# Patient Record
Sex: Male | Born: 1950
Health system: Southern US, Community
[De-identification: ages and names within clinical notes are randomized; demographics above are authoritative.]

## PROBLEM LIST (undated history)

## (undated) DIAGNOSIS — R7303 Prediabetes: Secondary | ICD-10-CM

## (undated) DIAGNOSIS — I4891 Unspecified atrial fibrillation: Secondary | ICD-10-CM

## (undated) DIAGNOSIS — N529 Male erectile dysfunction, unspecified: Secondary | ICD-10-CM

## (undated) HISTORY — DX: Prediabetes: R73.03

## (undated) HISTORY — DX: Male erectile dysfunction, unspecified: N52.9

## (undated) HISTORY — PX: OTHER SURGICAL HISTORY: SHX169

## (undated) HISTORY — DX: Unspecified atrial fibrillation: I48.91

---

## 2003-02-28 HISTORY — PX: CHOLECYSTECTOMY: SHX55

## 2013-02-27 DIAGNOSIS — I4891 Unspecified atrial fibrillation: Secondary | ICD-10-CM

## 2013-02-27 HISTORY — DX: Unspecified atrial fibrillation: I48.91

## 2014-01-07 ENCOUNTER — Telehealth: Payer: Self-pay | Admitting: Internal Medicine

## 2014-01-07 NOTE — Telephone Encounter (Signed)
Received records from [facility] (Dr Lerry Linerwight Williams) for appointment on 01/28/14 with Dr Rennis GoldenHilty.  [facility] for Dr Blanchie DessertHilty's schedule on 01/28/14.  lp

## 2014-01-28 ENCOUNTER — Ambulatory Visit: Payer: Self-pay | Admitting: Internal Medicine

## 2014-02-02 ENCOUNTER — Other Ambulatory Visit: Payer: Self-pay

## 2014-03-20 ENCOUNTER — Encounter: Payer: Self-pay | Admitting: Internal Medicine

## 2014-04-07 ENCOUNTER — Encounter: Payer: Self-pay | Admitting: Internal Medicine

## 2014-04-07 ENCOUNTER — Ambulatory Visit (INDEPENDENT_AMBULATORY_CARE_PROVIDER_SITE_OTHER): Payer: 59 | Admitting: Internal Medicine

## 2014-04-07 VITALS — BP 118/78 | HR 90 | Temp 98.0°F | Ht 68.0 in | Wt 227.0 lb

## 2014-04-07 DIAGNOSIS — I482 Chronic atrial fibrillation, unspecified: Secondary | ICD-10-CM

## 2014-04-07 DIAGNOSIS — Z0000 Encounter for general adult medical examination without abnormal findings: Secondary | ICD-10-CM

## 2014-04-07 DIAGNOSIS — I4891 Unspecified atrial fibrillation: Secondary | ICD-10-CM | POA: Insufficient documentation

## 2014-04-07 DIAGNOSIS — R7309 Other abnormal glucose: Secondary | ICD-10-CM

## 2014-04-07 DIAGNOSIS — R7303 Prediabetes: Secondary | ICD-10-CM

## 2014-04-07 DIAGNOSIS — R739 Hyperglycemia, unspecified: Secondary | ICD-10-CM | POA: Insufficient documentation

## 2014-04-07 NOTE — Patient Instructions (Signed)
Get your blood work before you leave    Please come back to the office in 3 months  for a routine check up   

## 2014-04-07 NOTE — Assessment & Plan Note (Addendum)
Td ~ 8 years ago per patient  Labs from 09-2013:  BMP, LFTs normal.  Cholesterol 153, LDL 94, triglycerides 125. Will check a CBC, repeat the BMP, TSH.   Colon cancer screening: Never had a colonoscopy, dx today w/  A. fib, we will discuss screening at the next visit.  DRE negative today, check a PSA  Diet   discussed. For now, I recommend to hold exercise until he  sees cardiology, the patient request a letter  stating that

## 2014-04-07 NOTE — Assessment & Plan Note (Addendum)
Few months ago was told he has prediabetes, took farxiga temporarily. I couldn't find the  A1c results Plan: Check A1c

## 2014-04-07 NOTE — Progress Notes (Signed)
Pre visit review using our clinic review tool, if applicable. No additional management support is needed unless otherwise documented below in the visit note. 

## 2014-04-07 NOTE — Assessment & Plan Note (Addendum)
On exam the pulse appeared regular but his EKG   showed atrial fibrillation, looks similar to previously EKG 09-2013 Last year he was referred to cardiology but that never happened  I discussed the case with cardiology, they will call the patient and set an appointment in the next few days, we decided to hold   anticoagulants and I recommend patient to start an aspirin daily. i discussed what a fib is and risk of strokes

## 2014-04-07 NOTE — Progress Notes (Signed)
Subjective:    Patient ID: Sean Frye, male    DOB: 11-07-1950, 64 y.o.   MRN: [id number]  DOS:  04/07/2014 Type of visit - description : New patient, request a physical exam.  In general feeling well. Had labs done 09-2013, apparently was diagnosed with prediabetes and prescribed farxiga which he took for a couple of months. Also, an EKG was abnormal and was referred to cardiology, denies any cardiopulmonary symptoms, see review of systems. He never got to see the heart doctor.   Review of Systems  Constitutional: Negative for diaphoresis, appetite change and unexpected weight change.  HENT: Negative for dental problem, ear discharge, facial swelling, trouble swallowing and voice change.   Eyes: Negative for photophobia, discharge and redness.  Respiratory:       No cough or sputum production. No wheezing or SOB  Cardiovascular:       No CP No edema or  palpitations   Gastrointestinal: Negative for nausea, vomiting, abdominal pain and diarrhea.       No blood in the stools   Endocrine: Negative for polydipsia, polyphagia and polyuria.  Genitourinary: Negative for urgency, frequency, hematuria and difficulty urinating.       No dysuria  Musculoskeletal: Negative for joint swelling.       No unusual aches or pains   Skin: Negative for color change, pallor and rash.  Allergic/Immunologic: Negative for environmental allergies and food allergies.  Neurological: Negative for dizziness, syncope and speech difficulty.       No headaches   Hematological: Negative for adenopathy. Does not bruise/bleed easily.  Psychiatric/Behavioral: Negative for suicidal ideas, hallucinations, behavioral problems and confusion.       No unusual or severe anxiety-depression     Past Medical History  Diagnosis Date  . Prediabetes     Past Surgical History  Procedure Laterality Date  . Lipoma removal    . Cholecystectomy  2005    History   Social History  . Marital Status: Single   Spouse Name: N/A    Number of Children: 5   . Years of Education: N/A   Occupational History  . Barrister's clerkairplane mechanic, [employer]    Social History Main Topics  . Smoking status: Never Smoker   . Smokeless tobacco: Never Used  . Alcohol Use: No  . Drug Use: No  . Sexual Activity: Not on file   Other Topics Concern  . Not on file   Social History Narrative   [place] from [place]   Lives w/ Mrs Darcel Bayleyrma     Family History  Problem Relation Age of Onset  . CAD Neg Hx   . Diabetes Sister   . Diabetes Mother   . Colon cancer Neg Hx   . Prostate cancer Father     ? of cancer        Medication List       This list is accurate as of: 04/07/14  7:27 PM.  Always use your most recent med list.               aspirin 81 MG tablet  Take 81 mg by mouth daily.           Objective:   Physical Exam  Constitutional: He is oriented to person, place, and time. He appears well-developed. No distress.  HENT:  Head: Normocephalic and atraumatic.  Neck: Normal range of motion. Neck supple. No thyromegaly present.  Normal carotid pulses  Cardiovascular:  RRR, no murmur, rub or gallop  Pulmonary/Chest: Effort normal. No stridor. No respiratory distress.  CTA B  Abdominal: Soft. Bowel sounds are normal. He exhibits no distension and no mass. There is no tenderness. There is no rebound and no guarding.  Genitourinary:  Rectal: No external abnormalities noted. Normal sphincter tone. No rectal masses or tenderness.  Stool: none found Prostate: Prostate gland firm and smooth, no enlargement, nodularity, tenderness, mass, asymmetry or induration.  Musculoskeletal: Normal range of motion. He exhibits no edema or tenderness.  Lymphadenopathy:    He has no cervical adenopathy.  Neurological: He is alert and oriented to person, place, and time. No cranial nerve deficit. He exhibits normal muscle tone. Coordination normal.  Speech normal, gait unassisted and normal for age, motor strength  appropriate for age   Skin: Skin is warm and dry. No pallor.  No jaundice  Psychiatric: He has a normal mood and affect. His behavior is normal. Judgment and thought content normal.  Vitals reviewed.        Assessment & Plan:    Today , I addition to the CPX Ispent more than  20  min with the patient discussing the dx of atrial fib, prediabetes and coordinating his care   Problem List Items Addressed This Visit      Cardiovascular and Mediastinum   Atrial fibrillation    EKG from 09-2013 showed atrial fibrillation, he was referred to cardiology but that never happened  EKG today looks similar. I discussed the case with cardiology, they will call the patient and set an appointment in the next few days, we decided to hold   anticoagulants and I recommend patient to start an aspirin daily.      Relevant Medications   aspirin 81 MG tablet     Endocrine   Prediabetes - Primary    Few months ago was told he has prediabetes, took far see had temporarily. I don't see the A1c results Plan: Check A1c      Relevant Orders   Hemoglobin A1c     Other   Annual physical exam    Td ~ 8 years ago per patient  Labs from 09-2013:  BMP, LFTs normal.  Cholesterol 153, LDL 94, triglycerides 125. Will check a CBC, repeat the BMP, TSH.   Colon cancer screening: Never had a colonoscopy, dx today w/  A. fib, we will discuss screening at the next visit.  DRE negative today, check a PSA  Diet   discussed. For now, I recommend to hold exercise until he  sees cardiology, the patient request a letter  stating that        Relevant Orders   EKG 12-Lead (Completed)   TSH   Basic metabolic panel   CBC with Differential/Platelet

## 2014-04-08 ENCOUNTER — Telehealth: Payer: Self-pay | Admitting: Internal Medicine

## 2014-04-08 DIAGNOSIS — I4891 Unspecified atrial fibrillation: Secondary | ICD-10-CM

## 2014-04-08 LAB — CBC WITH DIFFERENTIAL/PLATELET
BASOS PCT: 0.5 % (ref 0.0–3.0)
Basophils Absolute: 0 10*3/uL (ref 0.0–0.1)
Eosinophils Absolute: 0.3 10*3/uL (ref 0.0–0.7)
Eosinophils Relative: 3.1 % (ref 0.0–5.0)
HEMATOCRIT: 48.3 % (ref 39.0–52.0)
Hemoglobin: 16.4 g/dL (ref 13.0–17.0)
LYMPHS PCT: 31.3 % (ref 12.0–46.0)
Lymphs Abs: 2.6 10*3/uL (ref 0.7–4.0)
MCHC: 34 g/dL (ref 30.0–36.0)
MCV: 83.6 fl (ref 78.0–100.0)
Monocytes Absolute: 0.4 10*3/uL (ref 0.1–1.0)
Monocytes Relative: 4.4 % (ref 3.0–12.0)
Neutro Abs: 5 10*3/uL (ref 1.4–7.7)
Neutrophils Relative %: 60.7 % (ref 43.0–77.0)
Platelets: 228 10*3/uL (ref 150.0–400.0)
RBC: 5.78 Mil/uL (ref 4.22–5.81)
RDW: 14.5 % (ref 11.5–15.5)
WBC: 8.3 10*3/uL (ref 4.0–10.5)

## 2014-04-08 LAB — BASIC METABOLIC PANEL
BUN: 16 mg/dL (ref 6–23)
CO2: 26 mEq/L (ref 19–32)
Calcium: 8.9 mg/dL (ref 8.4–10.5)
Chloride: 104 mEq/L (ref 96–112)
Creatinine, Ser: 1.05 mg/dL (ref 0.40–1.50)
GFR: 75.97 mL/min (ref >60.00)
Glucose, Bld: 101 mg/dL — ABNORMAL HIGH (ref 70–99)
Potassium: 3.8 mEq/L (ref 3.5–5.1)
Sodium: 138 mEq/L (ref 135–145)

## 2014-04-08 LAB — HEMOGLOBIN A1C: HEMOGLOBIN A1C: 6.7 % — AB (ref 4.6–6.5)

## 2014-04-08 LAB — TSH: TSH: 2.31 u[IU]/mL (ref 0.35–4.50)

## 2014-04-08 NOTE — Telephone Encounter (Signed)
Done

## 2014-04-08 NOTE — Telephone Encounter (Signed)
Caller name:Sharon Relationship to patient:Cardiology Can be reached:[phone] Pharmacy:  Reason for call:Need order in system for referral to cardiology

## 2014-05-05 ENCOUNTER — Ambulatory Visit (INDEPENDENT_AMBULATORY_CARE_PROVIDER_SITE_OTHER): Payer: 59 | Admitting: Internal Medicine

## 2014-05-05 ENCOUNTER — Encounter: Payer: Self-pay | Admitting: Internal Medicine

## 2014-05-05 VITALS — BP 124/76 | HR 94 | Temp 98.3°F | Ht 68.0 in | Wt 224.0 lb

## 2014-05-05 DIAGNOSIS — M25569 Pain in unspecified knee: Secondary | ICD-10-CM

## 2014-05-05 NOTE — Progress Notes (Signed)
   Subjective:    Patient ID: Sean Frye, male    DOB: [birth year], 64 y.o.   MRN: [id number]  DOS:  05/05/2014 Type of visit - description : acute Interval history: 3-4 months history of lower extremity pain. The pain is like a muscle ache located anteriorly proximal to the knees and also having some pain at the left Achilles tendon. Has not taken any medication for it.    Review of Systems Denies chest pain, difficulty breathing or palpitations. No lower extremity discoloration. No calve swelling or pain per se (he does have a asymmetry, see physical exam) No claudication, no back pain.  Past Medical History  Diagnosis Date  . Prediabetes     Past Surgical History  Procedure Laterality Date  . Lipoma removal    . Cholecystectomy  2005    History   Social History  . Marital Status: Single    Spouse Name: N/A  . Number of Children: 5   . Years of Education: N/A   Occupational History  . Barrister's clerkairplane mechanic, [employer]    Social History Main Topics  . Smoking status: Never Smoker   . Smokeless tobacco: Never Used  . Alcohol Use: No  . Drug Use: No  . Sexual Activity: Not on file   Other Topics Concern  . Not on file   Social History Narrative   [place]   Lives w/ Mrs Darcel Bayleyrma   Former professional soccer player        Medication List       This list is accurate as of: 05/05/14 11:59 PM.  Always use your most recent med list.               aspirin 81 MG tablet  Take 81 mg by mouth daily.     fluticasone 27.5 MCG/SPRAY nasal spray  Commonly known as:  VERAMYST  Place 2 sprays into the nose daily.           Objective:   Physical Exam BP 124/76 mmHg  Pulse 94  Temp(Src) 98.3 F (36.8 C) (Oral)  Ht 5\' 8"  (1.727 m)  Wt 224 lb (101.606 kg)  BMI 34.07 kg/m2  SpO2 97%  General:   Well developed, well nourished . NAD.  HEENT:  Normocephalic . Face symmetric, atraumatic  CV-- irregular Muscle skeletal: no pretibial edema  bilaterally  Normal femoral and pedal pulses Feet normal to inspection and palpation, warm, good capillary refill throughout. Left calf  normal. Right calf: the gastrocnemius is much more prominent on the right side, but no calf swelling per se Skin: Not pale. Not jaundice Neurologic:  alert & oriented X3.  Speech normal, gait appropriate for age and unassisted Psych--  Cognition and judgment appear intact.  Cooperative with normal attention span and concentration.  Behavior appropriate. No anxious or depressed appearing.       Assessment & Plan:    Lower extremity pain, No evidence of venous  or arterial vascular deficit. Doubt neurogenic claudication as symptoms are mostly at rest. Plan: Stretching, Tylenol, observation for now  Atrial fibrillation, irreg heart rate, patient asymptomatic and takes aspirin, to see cardiology tomorrow

## 2014-05-05 NOTE — Patient Instructions (Signed)
Take Tylenol 500 mg 2 tablets 3 times a day as needed for pain. Come back in 2 months for a checkup  Tome tylenol de 500 mg 2 tabletas hasta 3 veces al dia regrese en 2 meses para un chequeo

## 2014-05-05 NOTE — Progress Notes (Signed)
Pre visit review using our clinic review tool, if applicable. No additional management support is needed unless otherwise documented below in the visit note. 

## 2014-05-06 ENCOUNTER — Encounter: Payer: Self-pay | Admitting: Internal Medicine

## 2014-05-06 ENCOUNTER — Ambulatory Visit (INDEPENDENT_AMBULATORY_CARE_PROVIDER_SITE_OTHER): Payer: 59 | Admitting: Internal Medicine

## 2014-05-06 VITALS — BP 124/62 | HR 105 | Ht 71.0 in | Wt 224.6 lb

## 2014-05-06 DIAGNOSIS — I4891 Unspecified atrial fibrillation: Secondary | ICD-10-CM

## 2014-05-06 DIAGNOSIS — Z79899 Other long term (current) drug therapy: Secondary | ICD-10-CM

## 2014-05-06 DIAGNOSIS — R5383 Other fatigue: Secondary | ICD-10-CM

## 2014-05-06 DIAGNOSIS — Z01818 Encounter for other preprocedural examination: Secondary | ICD-10-CM

## 2014-05-06 DIAGNOSIS — D689 Coagulation defect, unspecified: Secondary | ICD-10-CM

## 2014-05-06 MED ORDER — RIVAROXABAN 20 MG PO TABS: 20.0000 mg | ORAL_TABLET | Freq: Every day | ORAL | Status: DC

## 2014-05-06 MED ORDER — METOPROLOL SUCCINATE ER 25 MG PO TB24: 12.5000 mg | ORAL_TABLET | Freq: Every day | ORAL | Status: DC

## 2014-05-06 NOTE — Progress Notes (Signed)
OFFICE NOTE  Chief Complaint:  Atrial fibrillation  Primary Care Physician: Willow Ora, MD  HPI:  Sean Frye is a pleasant 64 year old male who is coming referred to me for evaluation of atrial fibrillation. Apparently he had an EKG in August of this past year which demonstrated A. fib and was referred to cardiology, but follow-up was never made. He was scheduled to see me in December but had to travel to [place] for family. He recently saw his primary care provider and was again noted to be in A. fib. He was started on aspirin which she's been taking for 2 weeks. He is 49 with no cardiac risk factors therefore has a low CHADSVASC score of 0-1. He is totally unaware of his atrial fibrillation. He denies any worsening shortness of breath, fatigue or exercise intolerance. He also denies any chest pain. He continues to do some exercise and some weight lifting without any limitations. He reports good sleep at night and denies snoring or any signs of sleep apnea. There is no history that he is aware of of A. fib in the family.  PMHx:  Past Medical History  Diagnosis Date  . Prediabetes     Past Surgical History  Procedure Laterality Date  . Lipoma removal    . Cholecystectomy  2005    FAMHx:  Family History  Problem Relation Age of Onset  . CAD Neg Hx   . Diabetes Sister   . Diabetes Mother     cirrhosis  . Colon cancer Neg Hx   . Prostate cancer Father     ? of cancer     SOCHx:   reports that he has never smoked. He has never used smokeless tobacco. He reports that he does not drink alcohol or use illicit drugs.  ALLERGIES:  No Known Allergies  ROS: A comprehensive review of systems was negative.  HOME MEDS: Current Outpatient Prescriptions  Medication Sig Dispense Refill  . fluticasone (VERAMYST) 27.5 MCG/SPRAY nasal spray Place 2 sprays into the Frye daily.    . NON FORMULARY as directed. Libera - for sinuses - from [place]    . metoprolol succinate (TOPROL XL) 25  MG 24 hr tablet Take 0.5 tablets (12.5 mg total) by mouth daily. 15 tablet 6  . rivaroxaban (XARELTO) 20 MG TABS tablet Take 1 tablet (20 mg total) by mouth daily with supper. 30 tablet 6   No current facility-administered medications for this visit.    LABS/IMAGING: No results found for this or any previous visit (from the past 48 hour(s)). No results found.  VITALS: BP 124/62 mmHg  Pulse 105  Ht  (1.803 m)  Wt 224 lb 9.6 oz (101.878 kg)  BMI 31.34 kg/m2  EXAM: General appearance: alert and no distress Neck: no carotid bruit and no JVD Lungs: clear to auscultation bilaterally Heart: irregularly irregular rhythm Abdomen: soft, non-tender; bowel sounds normal; no masses,  no organomegaly Extremities: extremities normal, atraumatic, no cyanosis or edema Pulses: 2+ and symmetric Skin: Skin color, texture, turgor normal. No rashes or lesions Neurologic: Grossly normal Psych: Normal  EKG: Atrial fibrillation with rapid ventricular response at 105  ASSESSMENT: 1. A. fib with mild RVR 2. Low CHADSVASC score of 0-1  PLAN: 1.   [name] has atrial fibrillation for which she is asymptomatic. There is mild increase in heart rate. I would recommend starting low-dose Toprol XL 12.5 mg daily to help lower his heart rate. In addition, I'm recommending a TEE cardioversion. I  discussed the risks and benefits of this procedure and feel that this is the most expeditious way to identify whether he has developed any clot in the heart that could put him at risk for stroke with cardioversion. If the study is negative, then we would go ahead with cardioversion. Is unclear how long he's been in A. fib, so there is a possibility that he may not convert back to sinus. I discussed the risks and benefits of this procedure and he did provide informed consent through a Engineer, structuralpanish translator. I will start him today on Xarelto 20 mg daily and advised him to stop the aspirin. Based on his commercial  insurance, he should be able to get the Xarelto at no cost. He will need to be on Xarelto for at least 3 days before we could do a cardioversion. He was advised to take the Xarelto with food for best absorption. Counseling on side effects and bleeding possibilities on that medicine was provided by Belenda CruiseKristin, our anticoagulation pharmacist.  I will keep you informed of the results of his TEE cardioversion in the near future. Thanks as always for the kind referral.  Sean NoseKenneth C. Lyniah Fujita, MD, [facility] Farmington HillsFACC Attending Cardiologist [facility]  Neithan Day C 05/06/2014, 5:06 PM

## 2014-05-06 NOTE — Patient Instructions (Addendum)
Your physician has recommended you make the following change in your medication:  1. STOP aspirin.  2. START xarelto 67m once daily.  3. START metoprolol succinate 12.574m(1/2 of a 2523mablet) once daily - for heart rate   Dr. HilDebara Picketts ordered a TEE/Cardio[facility] and a chest x-ray 3-5 days prior to your procedure >> please go to [place] [facility] [employer]  >> Gre[facility] Scriptschest x-ray   Fibrilacin auricular  (Atrial Fibrillation)  La fibrilacin auricular es un trastorno en el que el corazn late de manera irregular. Tambin puede causar que el corazn lata ms rpido que lo normal. Puede impedir que el corazn bombee la sangre normalmente. Aumenta el riesgo de ictus y de problemas cardacos.  CUIDADOS EN EL HOGAR   Tome los medicamentos como le indic el mdico.  Slo tome los medicamentos que el mdico considere seguros. Algunos medicamentos pueden hacer que el trastorno empeore o que ocurra nuevamente.  Si el mdico le receta anticoagulantes, tmelos exactamente segn las indicaciones. Un exceso de antStage manageri recibe una cantidad insuficiente, no tendr la proteccin necesaria contra el ictus y otrXcel EnergyRealice anlisis de sanUGI Corporationsa, si se lo indic el mdico.  Haga los anlApplied Materialsdic el mdico.  No beba alcohol.  No ingiera bebidas que contengan cafena, como el caf, las gaseosas y algunos tipos de t.  Mantenga un peso saludable.  No utilice pldoras para adelgazar excepto que su mdico le diga que son seguras. Pueden empeorar el problema cardaco.  Siga su dieta segn las indicaciones de su mdico.  Haga ejercicios tal como le indic el mdiLong Viewsitas de control. SOLICITE AYUDA SI:   Nota un cambio en la frecuencia, el ritmo o en la fuerza de sus latidos  cardacos.  Tiene ganas de hacer pis (orinar) con ms frecuencia.  Se cansa con ms facilidad al moverse o al hacer ejercicios. SOLICITE AYUDA DE INMEDIATO SI:   Siente un dolor en el pecho o en el vientre (abdomen).  Tiene malHigher education careers adviseruseas).  Tiene dificultad para respirar.  Se le hinchan los pies y los tobillos de manCaspianpentina.  Tiene mareos.  Siente el rostro, los brazos o las piernas adormecidos o dbiles.  Hay algn cambio en la visin o en el habla. ASEGRESE DE QUE:   Comprende estas instrucciones.  Controlar su enfermedad.  Solicitar ayuda de inmediato si no mejora o si empeora. Document Released: 05/12/2008 Document Revised: 06/30/2013 [facility] Cent[facility] Information 2015 ExiMurphyhis information is not intended to replace advice given to you by your health care provider. Make sure you discuss any questions you have with your health care provider.   Cardioversin elctrica (ElMagazine features editora cardioversin elctrica es la aplicacin de un impacto elctrico para modificar el ritmo cardaco. Se colocan unos parches con pegamento o placas metlicas sobre el pecho para enviar electricidad desde un dispositivo. Esto se utiliza para restablecer el ritmo cardaco normal. Un ritmo demasiado rpido o irregular impide que el corazn bombee bieGeneral Electrica cardioversin elctrica se realiza en una emergencia si:   Como resultado del ritmo cardaco la presin arterial es baja o no hay presin.  El ritmo normal debe restablecerse lo antes posible para proteger al cerebro y al corazn de un dao ms importante.  Puede salvar la vida. La cardioversin puede realizarse para ritmos cardacos que no pongan inmediatamente en peligro la vida, como en el caso de aleteo o fibrilacin auricular, si:   El corazn late [place] rpido o de manera irregular.  Los medicamentos para cambiar el ritmo cardaco no [employer].  Es seguro esperar para  permitir un tiempo de preparacin.  Los sntomas de ritmo cardaco anormal son molestos.  El riesgo de ictus y de otros problemas graves puede reducirse. INFORME A SU MDICO:   Cualquier alergia que tenga.  Todos los L[employer]ell [employer], incluidos vitaminas, hierbas, gotas oftlmicas, cremas y medicamentos de venta libre.  Problemas previos que usted o los UnitedHealth de su familia hayan tenido con el uso de anestsicos.  Enfermedades de la sangre que tenga.  Cirugas previas.  Enfermedades que tenga. RIESGOS Y COMPLICACIONES  En general, se trata de un procedimiento seguro. Sin embargo, pueden ocurrir [employer], por ejemplo:   Problemas respiratorios relacionados con la anestesia.  Un cogulo de [employer] se desprende y [place] hacia otras partes del cuerpo. Esto puede causar un ictus u otros problemas. Este riesgo se reduce con el uso de medicamentos anticoagulantes antes del procedimiento.  Paro cardaco (poco frecuente). ANTES DEL PROCEDIMIENTO   Pueden hacerle estudios para detectar cogulos sanguneos en el corazn y para evaluar la actividad cardaca.  Podrn indicarle que comience a tomar anticoagulantes para que su sangre no forme cogulos fcilmente.  Le darn medicamentos para estabilizar su frecuencia y ritmo cardaco. PROCEDIMIENTO  Le administrarn un medicamento a travs de una va intravenosa (IV) para reducir las molestias y Nature conservation officer dormir (sedante).  Se enviar una descarga elctrica. DESPUS DEL PROCEDIMIENTO El ritmo cardaco se observar para asegurarse de que no cambia.  Document Released: 02/13/2005 Document Revised: 06/30/2013 [facility] Patient Information 2015 [place], [place]. This information is not intended to replace advice given to you by your health care provider. Make sure you discuss any questions you have with your health care provider.   Ecocardiograma transesofgico (Transesophageal Echocardiogram) El ecocardiograma  transesofgico (ETE) es un estudio de imgenes de su corazn que se realiza mediante ondas de sonido. Las [employer] se obtienen del corazn son Automotive engineer. Esto puede ayudar al mdico a detectar si hay problemas con su corazn. El ETE puede controlar lo siguiente:  Si hay cogulos de Chiropractor.  Si las vlvulas cardacas estn funcionando bien.  Si tiene una infeccin en la parte interna del corazn.  Algunas de las arterias principales del corazn.  Si la vlvula cardaca funciona bien despus de una reparacin.  Su corazn antes de un procedimiento que [place] un choque elctrico para que el ritmo cardaco vuelva a lo normal. ANTES DEL PROCEDIMIENTO  No coma ni beba nada durante las 6 horas previas al procedimiento o segn le haya indicado el mdico.  Planifique para que alguien lo lleve de vuelta a su casa. No conduzca de regreso a Medical illustrator.  Le insertarn una va intravenosa en el brazo. PROCEDIMIENTO  Le administrarn un medicamento para ayudarlo a relajarse (sedante). Se lo administrarn a travs de la va intravenosa.  Le colocarn anestsicos en la parte posterior de la garganta para adormecerla.  La punta de la sonda se coloca en la parte posterior de la boca. Le pedirn que trague. Esto ayudar a que la sonda pase hacia el esfago.  Una vez que la punta de la sonda est en el lugar correcto, el mdico podr captar imgenes del corazn.  Es probable que sienta una presin en la parte posterior de la garganta. DESPUS DEL PROCEDIMIENTO  Lo llevarn a un rea de recuperacin hasta que el efecto del sedante haya desaparecido.  Es posible que sienta la garganta dolorida y con picazn. Esto mejorar lentamente con [employer].  Regresar a su casa cuando est totalmente despierto y pueda tragar lquidos.  Deber solicitarle a alguien que se quede con usted durante las siguientes 24 horas.  No conduzca ni opere mquinas durante las siguientes 24 horas. Document  Released: 03/18/2010 Document Revised: 02/18/2013 [facility] Patient Information 2015 [place]. This information is not intended to replace advice given to you by your health care provider. Make sure you discuss any questions you have with your health care provider.

## 2014-05-07 ENCOUNTER — Other Ambulatory Visit: Payer: Self-pay | Admitting: *Deleted

## 2014-05-07 DIAGNOSIS — I4891 Unspecified atrial fibrillation: Secondary | ICD-10-CM

## 2014-05-07 DIAGNOSIS — IMO0002 Reserved for concepts with insufficient information to code with codable children: Secondary | ICD-10-CM

## 2014-05-08 ENCOUNTER — Ambulatory Visit
Admission: RE | Admit: 2014-05-08 | Discharge: 2014-05-08 | Disposition: A | Discharge status: Home / Self Care | Payer: 59 | Source: Ambulatory Visit | Attending: Internal Medicine | Admitting: Internal Medicine

## 2014-05-08 DIAGNOSIS — Z01818 Encounter for other preprocedural examination: Secondary | ICD-10-CM

## 2014-05-08 LAB — CBC
HCT: 48.8 % (ref 39.0–52.0)
HEMOGLOBIN: 16 g/dL (ref 13.0–17.0)
MCH: 28.1 pg (ref 26.0–34.0)
MCHC: 32.8 g/dL (ref 30.0–36.0)
MCV: 85.8 fL (ref 78.0–100.0)
MPV: 10 fL (ref 8.6–12.4)
PLATELETS: 215 10*3/uL (ref 150–400)
RBC: 5.69 MIL/uL (ref 4.22–5.81)
RDW: 14.6 % (ref 11.5–15.5)
WBC: 6.2 10*3/uL (ref 4.0–10.5)

## 2014-05-08 LAB — BASIC METABOLIC PANEL
BUN: 18 mg/dL (ref 6–23)
CO2: 27 meq/L (ref 19–32)
Calcium: 9.2 mg/dL (ref 8.4–10.5)
Chloride: 105 mEq/L (ref 96–112)
Creat: 1.11 mg/dL (ref 0.50–1.35)
Glucose, Bld: 109 mg/dL — ABNORMAL HIGH (ref 70–99)
POTASSIUM: 4.5 meq/L (ref 3.5–5.3)
Sodium: 140 mEq/L (ref 135–145)

## 2014-05-09 LAB — TSH: TSH: 1.864 u[IU]/mL (ref 0.350–4.500)

## 2014-05-09 LAB — PROTIME-INR
INR: 1.45 (ref <1.50)
Prothrombin Time: 17.6 seconds — ABNORMAL HIGH (ref 11.6–15.2)

## 2014-05-09 LAB — APTT: APTT: 36 s (ref 24–37)

## 2014-05-12 ENCOUNTER — Encounter: Payer: Self-pay | Admitting: *Deleted

## 2014-05-14 ENCOUNTER — Ambulatory Visit (HOSPITAL_COMMUNITY): Payer: 59 | Admitting: Anesthesiology

## 2014-05-14 ENCOUNTER — Encounter (HOSPITAL_COMMUNITY)
Admission: RE | Disposition: A | Discharge status: Home / Self Care | Payer: Self-pay | Source: Ambulatory Visit | Attending: Internal Medicine

## 2014-05-14 ENCOUNTER — Ambulatory Visit (HOSPITAL_COMMUNITY)
Admission: RE | Admit: 2014-05-14 | Discharge: 2014-05-14 | Disposition: A | Discharge status: Home / Self Care | Payer: 59 | Source: Ambulatory Visit | Attending: Internal Medicine | Admitting: Internal Medicine

## 2014-05-14 ENCOUNTER — Encounter (HOSPITAL_COMMUNITY): Payer: Self-pay | Admitting: *Deleted

## 2014-05-14 DIAGNOSIS — IMO0002 Reserved for concepts with insufficient information to code with codable children: Secondary | ICD-10-CM

## 2014-05-14 DIAGNOSIS — I4891 Unspecified atrial fibrillation: Secondary | ICD-10-CM

## 2014-05-14 DIAGNOSIS — I340 Nonrheumatic mitral (valve) insufficiency: Secondary | ICD-10-CM

## 2014-05-14 HISTORY — PX: CARDIOVERSION: SHX1299

## 2014-05-14 HISTORY — PX: TEE WITHOUT CARDIOVERSION: SHX5443

## 2014-05-14 SURGERY — ECHOCARDIOGRAM, TRANSESOPHAGEAL
Anesthesia: Monitor Anesthesia Care

## 2014-05-14 MED ORDER — PROPOFOL INFUSION 10 MG/ML OPTIME
INTRAVENOUS | Status: DC | PRN
  Administered 2014-05-14: 160 ug/kg/min via INTRAVENOUS

## 2014-05-14 MED ORDER — LIDOCAINE VISCOUS 2 % MT SOLN
OROMUCOSAL | Status: AC
  Filled 2014-05-14: qty 15

## 2014-05-14 MED ORDER — SODIUM CHLORIDE 0.9 % IV SOLN
INTRAVENOUS | Status: DC
  Administered 2014-05-14: 13:00:00 via INTRAVENOUS

## 2014-05-14 MED ORDER — LIDOCAINE HCL (CARDIAC) 20 MG/ML IV SOLN
INTRAVENOUS | Status: DC | PRN
  Administered 2014-05-14: 80 mg via INTRAVENOUS

## 2014-05-14 MED ORDER — PHENYLEPHRINE HCL 10 MG/ML IJ SOLN
INTRAMUSCULAR | Status: DC | PRN
  Administered 2014-05-14: 80 ug via INTRAVENOUS

## 2014-05-14 MED ORDER — SODIUM CHLORIDE 0.9 % IV SOLN
INTRAVENOUS | Status: DC | PRN
  Administered 2014-05-14 (×2): via INTRAVENOUS

## 2014-05-14 MED ORDER — BUTAMBEN-TETRACAINE-BENZOCAINE 2-2-14 % EX AERO
INHALATION_SPRAY | CUTANEOUS | Status: DC | PRN
  Administered 2014-05-14: 2 via TOPICAL

## 2014-05-14 MED ORDER — LIDOCAINE VISCOUS 2 % MT SOLN
OROMUCOSAL | Status: DC | PRN
  Administered 2014-05-14: 1 via OROMUCOSAL

## 2014-05-14 MED ORDER — SODIUM CHLORIDE 0.9 % IJ SOLN: 3.0000 mL | INTRAMUSCULAR | Status: DC | PRN

## 2014-05-14 NOTE — Anesthesia Postprocedure Evaluation (Signed)
  Anesthesia Post-op Note  Patient: Sean BaronHector Frye  Procedure(s) Performed: Procedure(s): TRANSESOPHAGEAL ECHOCARDIOGRAM (TEE) (N/A) CARDIOVERSION (N/A)  Patient Location: PACU  Anesthesia Type:MAC  Level of Consciousness: awake, alert , oriented and patient cooperative  Airway and Oxygen Therapy: Patient Spontanous Breathing  Post-op Pain: none  Post-op Assessment: Post-op Vital signs reviewed, Patient's Cardiovascular Status Stable, Respiratory Function Stable, Patent Airway, No signs of Nausea or vomiting and Pain level controlled  Post-op Vital Signs: stable  Last Vitals:  Filed Vitals:   05/14/14 1411  BP: 110/81  Pulse: 62  Temp:   Resp: 15    Complications: No apparent anesthesia complications

## 2014-05-14 NOTE — Progress Notes (Signed)
Interpreter Wyvonnia DuskyGraciela Namihira for post electro-cardioversion

## 2014-05-14 NOTE — Transfer of Care (Signed)
Immediate Anesthesia Transfer of Care Note  Patient: Sean Frye  Procedure(s) Performed: Procedure(s): TRANSESOPHAGEAL ECHOCARDIOGRAM (TEE) (N/A) CARDIOVERSION (N/A)  Patient Location: PACU  Anesthesia Type:MAC and General  Level of Consciousness: awake, alert , oriented and patient cooperative  Airway & Oxygen Therapy: Patient Spontanous Breathing and Patient connected to nasal cannula oxygen  Post-op Assessment: Report given to RN, Post -op Vital signs reviewed and stable and Patient moving all extremities  Post vital signs: Reviewed and stable  Last Vitals: There were no vitals filed for this visit.  Complications: No apparent anesthesia complications

## 2014-05-14 NOTE — H&P (Signed)
     INTERVAL PROCEDURE H&P  History and Physical Interval Note:  05/14/2014 12:03 PM  Sean Frye has presented today for their planned procedure. The various methods of treatment have been discussed with the patient and family. After consideration of risks, benefits and other options for treatment, the patient has consented to the procedure.  The patients' outpatient history has been reviewed, patient examined, and no change in status from most recent office note within the past 30 days. I have reviewed the patients' chart and labs and will proceed as planned. Questions were answered to the patient's satisfaction.   Chrystie NoseKenneth C. Jadesola Poynter, MD, [facility] Attending Cardiologist [facility]  Deaysia Grigoryan C 05/14/2014, 12:03 PM

## 2014-05-14 NOTE — Progress Notes (Signed)
Echocardiogram Echocardiogram Transesophageal has been performed.  Sean Frye 05/14/2014, 1:43 PM

## 2014-05-14 NOTE — Anesthesia Preprocedure Evaluation (Addendum)
Anesthesia Evaluation  Patient identified by MRN, date of birth, ID band Patient awake    Reviewed: Allergy & Precautions, NPO status , Patient's Chart, lab work & pertinent test results  Airway        Dental   Pulmonary          Cardiovascular + dysrhythmias Atrial Fibrillation     Neuro/Psych    GI/Hepatic negative GI ROS, Neg liver ROS,   Endo/Other  negative endocrine ROS  Renal/GU negative Renal ROS     Musculoskeletal   Abdominal   Peds  Hematology negative hematology ROS (+)   Anesthesia Other Findings   Reproductive/Obstetrics                            Anesthesia Physical Anesthesia Plan  ASA: III  Anesthesia Plan: General and MAC   Post-op Pain Management:    Induction: Intravenous  Airway Management Planned: Simple Face Mask  Additional Equipment:   Intra-op Plan:   Post-operative Plan:   Informed Consent: I have reviewed the patients History and Physical, chart, labs and discussed the procedure including the risks, benefits and alternatives for the proposed anesthesia with the patient or authorized representative who has indicated his/her understanding and acceptance.     Plan Discussed with: CRNA, Anesthesiologist and Surgeon  Anesthesia Plan Comments:         Anesthesia Quick Evaluation

## 2014-05-14 NOTE — Anesthesia Procedure Notes (Signed)
Procedure Name: MAC Date/Time: 05/14/2014 1:05 PM Performed by: Coralee RudFLORES, Darin Arndt Pre-anesthesia Checklist: Patient identified, Emergency Drugs available, Suction available, Patient being monitored and Timeout performed Patient Re-evaluated:Patient Re-evaluated prior to inductionPreoxygenation: Pre-oxygenation with 100% oxygen Intubation Type: IV induction

## 2014-05-14 NOTE — Discharge Instructions (Signed)
Ecocardiograma transesofgico (Transesophageal Echocardiogram) El ecocardiograma transesofgico (ETE) es un estudio especial que produce imgenes del corazn a Proofreader del uso de ondas de sonido (ecocardiograma). Este tipo de ecocardiograma permite obtener mejores imgenes del Training and development officer en comparacin con Proofreader. Para realizar el ETE, se introduce un tubo flexible Librarian, academic al esfago. El corazn se encuentra por delante del esfago. Dado que el corazn y el esfago estn cerca uno de [place], [place] mdico puede tomar imgenes muy claras y Warden/ranger del corazn a travs de ondas de [place]. Se puede realizar un ETE en las siguientes circunstancias:  Si su mdico necesita ms informacin en funcin de los hallazgos del Wellsite geologist.  Si tuvo un ictus. Esto puede haber ocurrido debido a la formacin de Paramedic. El ETE permite visualizar diferentes reas del corazn y Hydrographic surveyor la presencia de cogulos.  Para revisar la anatoma y la funcin valvular.  Para buscar infecciones en el interior del corazn (endocarditis).  Para examinar la pared divisoria (tabique) del corazn y la presencia de un orificio que no se cerr despus del nacimiento (persistencia del foramen oval o defecto septal auricular).  Para diagnosticar una ruptura en la pared de la aorta (diseccin artica).  Durante una ciruga de vlvula cardaca. Esto le permite al cirujano examinar la reparacin de la aorta antes de cerrar el trax.  Durante una variedad de otros procedimientos cardacos para guiar la ubicacin de los catteres.  A veces, antes de una cardioversin, un choque para normalizar el ritmo cardaco. Catha Nottingham A SU MDICO:   Cualquier alergia que tenga.  Todos los [employer], incluidos vitaminas, hierbas, gotas oftlmicas, cremas y medicamentos de venta libre.  Problemas previos que usted o los [employer] de su familia hayan tenido con el uso de  anestsicos.  Enfermedades de [employer].  Cirugas previas.  Afecciones mdicas que tenga.  Dificultad para tragar.  Obstruccin esofgica RIESGOS Y COMPLICACIONES  En general, el ETE es un procedimiento seguro. Sin embargo, Games developer procedimiento, pueden surgir complicaciones. Las posibles complicaciones incluyen un desgarro esofgico (ruptura). ANTES DEL PROCEDIMIENTO   No coma ni beba nada durante las 6horas previas al procedimiento o segn le haya indicado su mdico.  Pdale a alguna persona que la lleve a su casa luego del procedimiento. No conduzca de regreso a Medical illustrator. Durante el procedimiento, le administrarn medicamentos que pueden causarle somnolencia y reducir sus reflejos.  Se le colocar una va intravenosa (IV) en el brazo. PROCEDIMIENTO   Se le administrar un medicamento para ayudarlo a que se relaje (sedante) a travs de la va intravenosa.  El medicamento se puede rociar o Copywriter, advertising grgaras para Tourist information centre manager parte posterior de la garganta.  Le controlarn la presin arterial, la frecuencia cardaca y la frecuencia respiratoria (signos vitales) durante el procedimiento.  La sonda del ETE es un tubo largo y flexible. Se coloca el extremo de la sonda en la parte posterior de la boca y se le pide que trague. Esto ayuda a introducir el extremo de Armed forces training and education officer. Una vez que la sonda est en la zona correcta, su mdico puede tomar imgenes del corazn.  Habitualmente, el ETE no es un procedimiento doloroso. Puede sentir la presin de la sonda contra la parte posterior de la garganta. La sonda no se introduce en la trquea y no le afecta la respiracin. DESPUS DEL PROCEDIMIENTO   Estar en cama y descansar hasta que haya despertado por completo.  Cuando lo  despierten, es posible que sienta un dolor leve en la garganta y probablemente an se sienta adormecido. Esto mejorar lentamente.  No podr comer ni beber hasta que est claro que ya  no est adormecido.  Una vez que haya podido beber, Garment/textile technologist y sentarse en la cama sin sentir ganas de vomitar (nuseas) o mareos, podr regresar a Holiday representative.  Debe estar acompaado por un amigo o un miembro de la familia durante las siguientes 24 horas luego del procedimiento. Document Released: 01/27/2008 Document Revised: 02/18/2013 [facility] Patient Information 2015 [place]. This information is not intended to replace advice given to you by your health care provider. Make sure you discuss any questions you have with your health care provider.  Cardioversin elctrica Magazine features editor) La cardioversin elctrica es la aplicacin de un impacto elctrico para modificar el ritmo cardaco. Se colocan unos parches con pegamento o placas metlicas sobre el pecho para enviar electricidad desde un dispositivo. Esto se utiliza para restablecer el ritmo cardaco normal. Un ritmo demasiado rpido o irregular impide que el corazn bombee [employer]. La cardioversin elctrica se realiza en una emergencia si:   Como resultado del ritmo cardaco la presin arterial es baja o no hay presin.  El ritmo normal debe restablecerse lo antes posible para proteger al cerebro y al corazn de un dao ms importante.  Puede salvar la vida. La cardioversin puede realizarse para ritmos cardacos que no pongan inmediatamente en peligro la vida, como en el caso de aleteo o fibrilacin auricular, si:   El corazn late [place] rpido o de manera irregular.  Los medicamentos para cambiar el ritmo cardaco no [employer].  Es seguro esperar para permitir un tiempo de preparacin.  Los sntomas de ritmo cardaco anormal son molestos.  El riesgo de ictus y de otros problemas graves puede reducirse. INFORME A SU MDICO:   Cualquier alergia que tenga.  Todos los [employer], incluidos vitaminas, hierbas, gotas oftlmicas, cremas y medicamentos de venta libre.  Problemas previos que  usted o los [employer] de su familia hayan tenido con el uso de anestsicos.  Enfermedades de la sangre que tenga.  Cirugas previas.  Enfermedades que tenga. RIESGOS Y COMPLICACIONES  En general, se trata de un procedimiento seguro. Sin embargo, pueden ocurrir [employer], por ejemplo:   Problemas respiratorios relacionados con la anestesia.  Un cogulo de [employer] se desprende y [place] hacia otras partes del cuerpo. Esto puede causar un ictus u otros problemas. Este riesgo se reduce con el uso de medicamentos anticoagulantes antes del procedimiento.  Paro cardaco (poco frecuente). ANTES DEL PROCEDIMIENTO   Pueden hacerle estudios para detectar cogulos sanguneos en el corazn y para evaluar la actividad cardaca.  Podrn indicarle que comience a tomar anticoagulantes para que su sangre no forme cogulos fcilmente.  Le darn medicamentos para estabilizar su frecuencia y ritmo cardaco. PROCEDIMIENTO  Le administrarn un medicamento a travs de una va intravenosa (IV) para reducir las molestias y Nature conservation officer dormir (sedante).  Se enviar una descarga elctrica. DESPUS DEL PROCEDIMIENTO El ritmo cardaco se observar para asegurarse de que no cambia.  Document Released: 02/13/2005 Document Revised: 06/30/2013 [facility] Patient Information 2015 [place], [place]. This information is not intended to replace advice given to you by your health care provider. Make sure you discuss any questions you have with your health care provider.   Excuse from Work, [employer], or Physical Activity __________Hector Velasquez________________________________________ needs to be excused from: ___x__ Work _____ [employer] _____ Physical activity Beginning now and through  the following date: ___3/17/16-3/20/16_________________ __x___ He/she may return to work or school but still avoid physical activity from now until: __3/21/16__________________ _____ He/she may return to full physical activity as of:  ____________________ Caregiver's signature: _____Dr. Hilty___________________________________  Date: __3/17/16____________________________________________________ Document Released: 08/09/2000 Document Revised: 05/08/2011 Document Reviewed: 02/13/2005 [employer] Patient Information 2015 [place], [place]. This information is not intended to replace advice given to you by your health care provider. Make sure you discuss any questions you have with your health care provider.

## 2014-05-14 NOTE — CV Procedure (Signed)
TEE/CARDIOVERSION NOTE  TRANSESOPHAGEAL ECHOCARDIOGRAM (TEE):  Indictation: Atrial Fibrillation  Consent:   Informed consent was obtained prior to the procedure. The risks, benefits and alternatives for the procedure were discussed and the patient comprehended these risks.  Risks include, but are not limited to, cough, sore throat, vomiting, nausea, somnolence, esophageal and stomach trauma or perforation, bleeding, low blood pressure, aspiration, pneumonia, infection, trauma to the teeth and death.    Time Out: Verified patient identification, verified procedure, site/side was marked, verified correct patient position, special equipment/implants available, medications/allergies/relevent history reviewed, required imaging and test results available. Performed  Procedure:  After a procedural time-out, the patient was given Propofol per anesthesia for sedation.  The oropharynx was anesthetized 2 cetacaine sprays and 10 cc of viscous lidocaine.  The transesophageal probe was inserted in the esophagus and stomach without difficulty and multiple views were obtained.  The patient was kept under observation until the patient left the procedure room.  The patient left the procedure room in stable condition.   Agitated microbubble saline contrast was administered.  Complications:    Complications: None Patient did tolerate procedure well.  Findings:  1. LEFT VENTRICLE: The left ventricular wall thickness is normal.  The left ventricular cavity is normal in size. Wall motion is normal.  LVEF is 55-60%.  2. RIGHT VENTRICLE:  The right ventricle is normal in structure and function without any thrombus or masses.    3. LEFT ATRIUM:  The left atrium is mildly dilated in size without any thrombus or masses.  There is not spontaneous echo contrast ("smoke") in the left atrium consistent with a low flow state.  4. LEFT ATRIAL APPENDAGE:  The left atrial appendage is free of any thrombus or  masses. The appendage has single lobes. Pulse doppler indicates moderate flow in the appendage.  5. ATRIAL SEPTUM:  The atrial septum appears intact and is free of thrombus and/or masses.  There is no evidence for interatrial shunting by color doppler and saline microbubble.  6. RIGHT ATRIUM:  The right atrium is normal in size and function without any thrombus or masses.  7. MITRAL VALVE:  The mitral valve is normal in structure and function with Mild regurgitation - there are 3 distinct small jets.  There were no vegetations or stenosis.  8. AORTIC VALVE:  The aortic valve is trileaflet (however, there is partial fusion of the base of the left and right coronary cusps), normal in structure and function with no regurgitation.  There were no vegetations or stenosis  9. TRICUSPID VALVE:  The tricuspid valve is normal in structure and function with Mild regurgitation.  There were no vegetations or stenosis  10.  PULMONIC VALVE:  The pulmonic valve is normal in structure and function with no regurgitation.  There were no vegetations or stenosis.   11. AORTIC ARCH, ASCENDING AND DESCENDING AORTA:  There was no Myrtis Ser(Katz et. Al, 1992) atherosclerosis of the ascending aorta, aortic arch, or proximal descending aorta.  12. PULMONARY VEINS: Anomalous pulmonary venous return was not noted.  13. PERICARDIUM: The pericardium appeared normal and non-thickened.  There is no pericardial effusion.  CARDIOVERSION:     Second Time Out: Verified patient identification, verified procedure, site/side was marked, verified correct patient position, special equipment/implants available, medications/allergies/relevent history reviewed, required imaging and test results available.  Performed  Procedure:  1. Patient placed on cardiac monitor, pulse oximetry, supplemental oxygen as necessary.  2. Sedation administered per anesthesia 3. Pacer pads placed anterior and posterior  chest. 4. Cardioverted 1 time(s).   5. Cardioverted at 150J biphasic.  Complications:  Complications: None Patient did tolerate procedure well.  Impression:  1. No LAA thrombus 2. No ASD or PFO 3. Mild MR with 3 distinct jets 4. Partially fused base of the right and left coronary cusp leaflets of the aortic valve, however, the valve is trileaflet - no stenosis or regurgitation 5. LVEF 55-60% 6. Successful DCCV to NSR with a single 150J biphasic shock  Recommendations:  1. Continue xarelto. Follow-up with me in 1-2 weeks in the office. 2. May need to consider sleep study.  Time Spent Directly with the Patient:  60 minutes   Chrystie Nose, MD, [facility] Attending Cardiologist [facility]  05/14/2014, 1:53 PM

## 2014-05-15 ENCOUNTER — Encounter (HOSPITAL_COMMUNITY): Payer: Self-pay | Admitting: Internal Medicine

## 2014-06-03 ENCOUNTER — Other Ambulatory Visit: Payer: Self-pay

## 2014-06-10 ENCOUNTER — Encounter: Payer: Self-pay | Admitting: Internal Medicine

## 2014-06-10 ENCOUNTER — Ambulatory Visit (INDEPENDENT_AMBULATORY_CARE_PROVIDER_SITE_OTHER): Payer: 59 | Admitting: Internal Medicine

## 2014-06-10 VITALS — BP 108/64 | HR 62 | Ht 68.0 in | Wt 222.2 lb

## 2014-06-10 DIAGNOSIS — I4891 Unspecified atrial fibrillation: Secondary | ICD-10-CM | POA: Diagnosis not present

## 2014-06-10 DIAGNOSIS — N521 Erectile dysfunction due to diseases classified elsewhere: Secondary | ICD-10-CM | POA: Diagnosis not present

## 2014-06-10 DIAGNOSIS — N529 Male erectile dysfunction, unspecified: Secondary | ICD-10-CM

## 2014-06-10 HISTORY — DX: Male erectile dysfunction, unspecified: N52.9

## 2014-06-10 MED ORDER — SILDENAFIL CITRATE 20 MG PO TABS: 20.0000 mg | ORAL_TABLET | ORAL | Status: DC | PRN

## 2014-06-10 MED ORDER — METOPROLOL SUCCINATE ER 25 MG PO TB24: 12.5000 mg | ORAL_TABLET | Freq: Two times a day (BID) | ORAL | Status: DC

## 2014-06-10 NOTE — Progress Notes (Signed)
OFFICE NOTE  Chief Complaint:  Atrial fibrillation  Primary Care Physician: Willow Ora, MD  HPI:  Miriam Kestler is a pleasant 64 year old male who is coming referred to me for evaluation of atrial fibrillation. Apparently he had an EKG in August of this past year which demonstrated A. fib and was referred to cardiology, but follow-up was never made. He was scheduled to see me in December but had to travel to [place] for family. He recently saw his primary care provider and was again noted to be in A. fib. He was started on aspirin which she's been taking for 2 weeks. He is 59 with no cardiac risk factors therefore has a low CHADSVASC score of 0-1. He is totally unaware of his atrial fibrillation. He denies any worsening shortness of breath, fatigue or exercise intolerance. He also denies any chest pain. He continues to do some exercise and some weight lifting without any limitations. He reports good sleep at night and denies snoring or any signs of sleep apnea. There is no history that he is aware of of A. fib in the family.  I recommended TEE cardioversion. He did undergo a TEE which showed no evidence of left atrial thrombus and normal LV function. There is mild valvular disease but nothing of concern. He underwent successful cardioversion and has been maintaining sinus rhythm. He says that he's feeling better and has more energy. He discontinued his Xarelto when he ran out of his prescription yesterday. He's also run out of metoprolol 3 days ago. He was not under the impression that he needed to maintain these medications. We had a long discussion today with the help of the Spanish translator, and I made it clear that he should stay on metoprolol to try to help keep him from going out of rhythm. However, because of his low risk for stroke, at this point I think he can be on daily aspirin. Xarelto was fairly expensive. There isn't clear data that he needs to be on it with his low CHADSVASC score now  one month post cardioversion. Should he have recurrence of A. fib, however he may need to go back on more significant anticoagulation.  PMHx:  Past Medical History  Diagnosis Date  . Prediabetes     Past Surgical History  Procedure Laterality Date  . Lipoma removal    . Cholecystectomy  2005  . Tee without cardioversion N/A 05/14/2014    Procedure: TRANSESOPHAGEAL ECHOCARDIOGRAM (TEE);  Surgeon: Chrystie Nose, MD;  Location: [facility] ENDOSCOPY;  Service: Cardiovascular;  Laterality: N/A;  . Cardioversion N/A 05/14/2014    Procedure: CARDIOVERSION;  Surgeon: Chrystie Nose, MD;  Location: [facility] ENDOSCOPY;  Service: Cardiovascular;  Laterality: N/A;    FAMHx:  Family History  Problem Relation Age of Onset  . CAD Neg Hx   . Diabetes Sister   . Diabetes Mother     cirrhosis  . Colon cancer Neg Hx   . Prostate cancer Father     ? of cancer     SOCHx:   reports that he has never smoked. He has never used smokeless tobacco. He reports that he does not drink alcohol or use illicit drugs.  ALLERGIES:  No Known Allergies  ROS: A comprehensive review of systems was negative.  HOME MEDS: Current Outpatient Prescriptions  Medication Sig Dispense Refill  . aspirin 81 MG tablet Take 81 mg by mouth daily.    . fluticasone (VERAMYST) 27.5 MCG/SPRAY nasal spray Place 2 sprays into the  nose daily.    . NON FORMULARY as directed. Libera - for sinuses - from [place]    . metoprolol succinate (TOPROL XL) 25 MG 24 hr tablet Take 0.5 tablets (12.5 mg total) by mouth 2 (two) times daily. 30 tablet 10  . sildenafil (REVATIO) 20 MG tablet Take 1 tablet (20 mg total) by mouth as needed. 1-2 tablets as needed before sexual activity 50 tablet 0   No current facility-administered medications for this visit.    LABS/IMAGING: No results found for this or any previous visit (from the past 48 hour(s)). No results found.  VITALS: BP 108/64 mmHg  Pulse 62  Ht 5\' 8"  (1.727 m)  Wt 222 lb 3.2 oz (100.789 kg)   BMI 33.79 kg/m2  EXAM: Deferred  EKG: Sinus rhythm at 62  ASSESSMENT: 1. A. fib with mild RVR - maintaining sinus after cardioversion 2. Low CHADSVASC score of 0-1 3. Erectile dysfunction  PLAN: 1.   [name] as maintain sinus rhythm after cardioversion. He's been on Xarelto for a month and I think at this point we can consider switching him back to aspirin 81 mg daily. He should stay on low-dose metoprolol for heart rate control. Finally, he is inquiring about erectile dysfunction. Apparently this was present prior to starting a beta blocker. He's in fact had the symptoms for many years and has a girlfriend and is concerned about this dysfunction. He is inquiring about possible treatments. I think it would be safe for him to consider low-dose Viagra. I've given her a prescription for that.  Follow-up with me in 6 months.  Chrystie NoseKenneth C. Kalsey Lull, MD, [facility] Attending Cardiologist [facility]  Ziggy Chanthavong C 06/10/2014, 5:42 PM

## 2014-06-10 NOTE — Patient Instructions (Addendum)
START Aspirin 81 mg daily  CONTINUE Metoprolol 12.5 bid  Your physician wants you to follow-up in: 6 months. You will receive a reminder letter in the mail two months in advance. If you don't receive a letter, please call our office to schedule the follow-up appointment.  We have sent you in a prescription for Sildenafil.

## 2014-06-11 ENCOUNTER — Encounter: Payer: Self-pay | Admitting: Medical

## 2014-06-11 ENCOUNTER — Ambulatory Visit (INDEPENDENT_AMBULATORY_CARE_PROVIDER_SITE_OTHER): Payer: 59 | Admitting: Medical

## 2014-06-11 VITALS — BP 150/85 | HR 62 | Temp 98.1°F | Ht 68.0 in | Wt 220.8 lb

## 2014-06-11 DIAGNOSIS — H5710 Ocular pain, unspecified eye: Secondary | ICD-10-CM | POA: Insufficient documentation

## 2014-06-11 DIAGNOSIS — H5711 Ocular pain, right eye: Secondary | ICD-10-CM

## 2014-06-11 MED ORDER — TOBRAMYCIN 0.3 % OP SOLN: 1.0000 [drp] | OPHTHALMIC | Status: DC

## 2014-06-11 MED ORDER — TRAMADOL HCL 50 MG PO TABS: 50.0000 mg | ORAL_TABLET | Freq: Four times a day (QID) | ORAL | Status: DC | PRN

## 2014-06-11 NOTE — Progress Notes (Signed)
Pre visit review using our clinic review tool, if applicable. No additional management support is needed unless otherwise documented below in the visit note. 

## 2014-06-11 NOTE — Assessment & Plan Note (Signed)
Pt needs to see optometrist tomorrow. Will put in stat/urgent referral that he be seen tomorrow.   Will rx tramadol to help with pain. But explain to him if pain improved he still needs to be seen.  Conjunctivitis may be present and will rx tobrex.  Explained to pt if his eye pain increases again like last night. If has spots in visual field, worsening vision, on light flashing etc then ED evaluation tonight.  Our staff should be calling him by 9:30 am. If not insturcted to call here and speak with Royal HawthornJackelyn.

## 2014-06-11 NOTE — Progress Notes (Signed)
Subjective:    Patient ID: Sean Frye, male    DOB: 1951/01/01, 64 y.o.   MRN: [id number]  HPI   Pt has right eye that is red. He was told cataracts and this was 2 months ago tx did not help. He has red eye for 3 months. Pt states some blurred vision daily for 5 days. He had throbbing pain last night 8/10. Now has mild burning to eyes  and watery.   Pt went to optometrist when dx with cataracts. But not opthalmologist. No light flashing. No spots in visual field. This morning had 8/10 level pain. Today level 3/10.   Pt does wear glasses. He does not have them with him.   Review of Systems  Constitutional: Negative for fever, chills and fatigue.  Eyes: Positive for pain.       Rt eye.  Respiratory: Negative for choking, shortness of breath and wheezing.   Cardiovascular: Negative for chest pain and palpitations.  Musculoskeletal: Negative for back pain.  Neurological: Negative for dizziness, seizures, speech difficulty, weakness and headaches.  Hematological: Negative for adenopathy. Does not bruise/bleed easily.   Past Medical History  Diagnosis Date  . Prediabetes     History   Social History  . Marital Status: Single    Spouse Name: N/A  . Number of Children: 5   . Years of Education: N/A   Occupational History  . Barrister's clerkairplane mechanic, [employer]    Social History Main Topics  . Smoking status: Never Smoker   . Smokeless tobacco: Never Used  . Alcohol Use: No  . Drug Use: No  . Sexual Activity: Not on file   Other Topics Concern  . Not on file   Social History Narrative   Original from [place]   Lives w/ Mrs Darcel Bayleyrma   Former professional soccer player    Past Surgical History  Procedure Laterality Date  . Lipoma removal    . Cholecystectomy  2005  . Tee without cardioversion N/A 05/14/2014    Procedure: TRANSESOPHAGEAL ECHOCARDIOGRAM (TEE);  Surgeon: Chrystie NoseKenneth C Hilty, MD;  [facility];  Service: Cardiovascular;  Laterality: N/A;  . Cardioversion  N/A 05/14/2014    Procedure: CARDIOVERSION;  Surgeon: Chrystie NoseKenneth C Hilty, MD;  [facility];  Service: Cardiovascular;  Laterality: N/A;    Family History  Problem Relation Age of Onset  . CAD Neg Hx   . Diabetes Sister   . Diabetes Mother     cirrhosis  . Colon cancer Neg Hx   . Prostate cancer Father     ? of cancer     No Known Allergies  Current Outpatient Prescriptions on File Prior to Visit  Medication Sig Dispense Refill  . aspirin 81 MG tablet Take 81 mg by mouth daily.    . fluticasone (VERAMYST) 27.5 MCG/SPRAY nasal spray Place 2 sprays into the nose daily.    . metoprolol succinate (TOPROL XL) 25 MG 24 hr tablet Take 0.5 tablets (12.5 mg total) by mouth 2 (two) times daily. 30 tablet 10  . NON FORMULARY as directed. Libera - for sinuses - from [place]    . sildenafil (REVATIO) 20 MG tablet Take 1 tablet (20 mg total) by mouth as needed. 1-2 tablets as needed before sexual activity 50 tablet 0   No current facility-administered medications on file prior to visit.    BP 150/85 mmHg  Pulse 62  Temp(Src) 98.1 F (36.7 C) (Oral)  Ht 5\' 8"  (1.727 m)  Wt 220 lb 12.8  oz (100.154 kg)  BMI 33.58 kg/m2  SpO2 100%      Objective:   Physical Exam  General- No acute distress. Pleasant patient. Neck- Full range of motion, no jvd Lungs- Clear, even and unlabored. Heart- regular rate and rhythm. Neurologic- CNII- XII grossly intact. Rt eye- very red eye. No dishcarge. No vesicle around his eye. No dilated exam done today. Lt eye normal.  Neuro- CN III-XII grossly intact.      Assessment & Plan:

## 2014-06-11 NOTE — Patient Instructions (Addendum)
Pain, eye, right Pt needs to see optometrist tomorrow. Will put in stat/urgent referral that he be seen tomorrow.   Will rx tramadol to help with pain. But explain to him if pain improved he still needs to be seen.  Conjunctivitis may be present and will rx tobrex.  Explained to pt if his eye pain increases again like last night. If has spots in visual field, worsening vision, on light flashing etc then ED evaluation tonight.  Our staff should be calling him by 9:30 am. If not insturcted to call here and speak with Royal HawthornJackelyn.

## 2014-06-11 NOTE — Assessment & Plan Note (Deleted)
Pt needs to see optometrist tomorrow. Will put in stat/urgent referral that he be seen tomorrow.   Will rx tramadol to help with pain. But explain to him if pain improved he still needs to be seen.  Conjunctivitis may be present and will rx tobrex.  Explained to pt if his eye pain increases again like last night. If has spots in visual field, worsening vision, on light flashing etc then ED evaluation tonight.  Our staff should be calling him by 9:30 am. If not insturcted to call here and speak with Jackelyn. 

## 2014-06-18 ENCOUNTER — Telehealth: Payer: Self-pay | Admitting: *Deleted

## 2014-06-18 NOTE — Telephone Encounter (Signed)
Faxed PA for sildenfil 20mg  tablets to [facility]

## 2014-07-07 ENCOUNTER — Encounter: Payer: Self-pay | Admitting: Internal Medicine

## 2014-07-07 ENCOUNTER — Other Ambulatory Visit: Payer: Self-pay

## 2014-07-07 ENCOUNTER — Ambulatory Visit (INDEPENDENT_AMBULATORY_CARE_PROVIDER_SITE_OTHER): Payer: 59 | Admitting: Internal Medicine

## 2014-07-07 VITALS — BP 134/78 | HR 67 | Temp 98.0°F | Ht 68.0 in | Wt 218.2 lb

## 2014-07-07 DIAGNOSIS — Z23 Encounter for immunization: Secondary | ICD-10-CM | POA: Diagnosis not present

## 2014-07-07 DIAGNOSIS — I4891 Unspecified atrial fibrillation: Secondary | ICD-10-CM | POA: Diagnosis not present

## 2014-07-07 DIAGNOSIS — R7303 Prediabetes: Secondary | ICD-10-CM

## 2014-07-07 DIAGNOSIS — N529 Male erectile dysfunction, unspecified: Secondary | ICD-10-CM | POA: Diagnosis not present

## 2014-07-07 DIAGNOSIS — R7309 Other abnormal glucose: Secondary | ICD-10-CM | POA: Diagnosis not present

## 2014-07-07 MED ORDER — SILDENAFIL CITRATE 20 MG PO TABS: 20.0000 mg | ORAL_TABLET | ORAL | Status: DC | PRN

## 2014-07-07 NOTE — Progress Notes (Signed)
Pre visit review using our clinic review tool, if applicable. No additional management support is needed unless otherwise documented below in the visit note. 

## 2014-07-07 NOTE — Assessment & Plan Note (Signed)
Recently he tried Viagra, we discussed the timing of taking the medication and potential for side effects, so far he has tried and is doing well. Plan: Refill Viagra as needed

## 2014-07-07 NOTE — Assessment & Plan Note (Signed)
Status post cardioversion, last note from cardiology reviewed, he well stay on aspirin, beta blockers. In retrospect, he is feeling great since the cardioversion, more energetic. nex visit w/ Cardiology by [phone]

## 2014-07-07 NOTE — Progress Notes (Signed)
Subjective:    Patient ID: Sean Frye, male    DOB: 07-13-1950, 64 y.o.   MRN: [id number]  DOS:  07/07/2014 Type of visit - description : f/u Interval history: Atrial fibrillation, status post cardioversion, on sinus rhythm since, saw cardiology, no reviewed. Feeling well. Prediabetes, has changed his diet, eating healthier, more active. ED, was prescribed Viagra, has some questions about the medication. He did try  and is helping   Review of Systems  Recently saw with conjunctivitis, improved. Denies chest pain, difficulty breathing, lower extremity edema or palpitations No nausea, vomiting, diarrhea   Past Medical History  Diagnosis Date  . Prediabetes     Past Surgical History  Procedure Laterality Date  . Lipoma removal    . Cholecystectomy  2005  . Tee without cardioversion N/A 05/14/2014    Procedure: TRANSESOPHAGEAL ECHOCARDIOGRAM (TEE);  Surgeon: Chrystie NoseKenneth C Hilty, MD;  [facility];  Service: Cardiovascular;  Laterality: N/A;  . Cardioversion N/A 05/14/2014    Procedure: CARDIOVERSION;  Surgeon: Chrystie NoseKenneth C Hilty, MD;  [facility];  Service: Cardiovascular;  Laterality: N/A;    History   Social History  . Marital Status: Single    Spouse Name: N/A  . Number of Children: 5   . Years of Education: N/A   Occupational History  . Barrister's clerkairplane mechanic, [employer]    Social History Main Topics  . Smoking status: Never Smoker   . Smokeless tobacco: Never Used  . Alcohol Use: No  . Drug Use: No  . Sexual Activity: Not on file   Other Topics Concern  . Not on file   Social History Narrative   Original from [place]   Lives w/ Mrs Darcel Bayleyrma   Former professional soccer player        Medication List       This list is accurate as of: 07/07/14  4:27 PM.  Always use your most recent med list.               aspirin 81 MG tablet  Take 81 mg by mouth daily.     fluticasone 27.5 MCG/SPRAY nasal spray  Commonly known as:  VERAMYST  Place 2  sprays into the nose daily.     metoprolol succinate 25 MG 24 hr tablet  Commonly known as:  TOPROL XL  Take 0.5 tablets (12.5 mg total) by mouth 2 (two) times daily.     NON FORMULARY  as directed. Libera - for sinuses - from [place]     sildenafil 20 MG tablet  Commonly known as:  REVATIO  Take 1 tablet (20 mg total) by mouth as needed. 1-2 tablets as needed before sexual activity     tobramycin 0.3 % ophthalmic solution  Commonly known as:  TOBREX  Place 1 drop into the right eye every 4 (four) hours.     traMADol 50 MG tablet  Commonly known as:  ULTRAM  Take 1 tablet (50 mg total) by mouth every 6 (six) hours as needed.           Objective:   Physical Exam BP 134/78 mmHg  Pulse 67  Temp(Src) 98 F (36.7 C) (Oral)  Ht 5\' 8"  (1.727 m)  Wt 218 lb 4 oz (98.998 kg)  BMI 33.19 kg/m2  SpO2 98%  General:   Well developed, well nourished . NAD.  HEENT:  Normocephalic . Face symmetric, atraumatic Lungs:  CTA B Normal respiratory effort, no intercostal retractions, no accessory muscle use. Heart: RRR,  no murmur.  no pretibial edema bilaterally  Abdomen:  Not distended, soft, non-tender. No rebound or rigidity. No mass,organomegaly Skin: Not pale. Not jaundice Neurologic:  alert & oriented X3.  Speech normal, gait appropriate for age and unassisted Psych--  Cognition and judgment appear intact.  Cooperative with normal attention span and concentration.  Behavior appropriate. No anxious or depressed appearing.      Assessment & Plan:

## 2014-07-07 NOTE — Patient Instructions (Signed)
Get your blood work before you leave    Come back to the office in 3-4 months   for a routine check up  Please schedule an appointment at the front desk    Come back fasting       

## 2014-07-07 NOTE — Assessment & Plan Note (Signed)
Has improved his diet, exercising more. Will check a A1c, AST, ALT. Last LDL was slightly elevated, he is not fasting, will recheck on return to the office in 4 months.

## 2014-07-08 LAB — HEMOGLOBIN A1C: Hgb A1c MFr Bld: 6.1 % (ref 4.6–6.5)

## 2014-07-08 LAB — AST: AST: 21 U/L (ref 0–37)

## 2014-07-08 LAB — ALT: ALT: 28 U/L (ref 0–53)

## 2014-11-11 ENCOUNTER — Encounter: Payer: Self-pay | Admitting: Internal Medicine

## 2014-11-11 ENCOUNTER — Ambulatory Visit (INDEPENDENT_AMBULATORY_CARE_PROVIDER_SITE_OTHER): Payer: 59 | Admitting: Internal Medicine

## 2014-11-11 VITALS — BP 126/78 | HR 64 | Temp 98.3°F | Ht 68.0 in | Wt 227.5 lb

## 2014-11-11 DIAGNOSIS — Z1159 Encounter for screening for other viral diseases: Secondary | ICD-10-CM

## 2014-11-11 DIAGNOSIS — I4891 Unspecified atrial fibrillation: Secondary | ICD-10-CM

## 2014-11-11 DIAGNOSIS — E119 Type 2 diabetes mellitus without complications: Secondary | ICD-10-CM

## 2014-11-11 DIAGNOSIS — Z23 Encounter for immunization: Secondary | ICD-10-CM

## 2014-11-11 DIAGNOSIS — Z125 Encounter for screening for malignant neoplasm of prostate: Secondary | ICD-10-CM

## 2014-11-11 DIAGNOSIS — Z114 Encounter for screening for human immunodeficiency virus [HIV]: Secondary | ICD-10-CM

## 2014-11-11 DIAGNOSIS — Z09 Encounter for follow-up examination after completed treatment for conditions other than malignant neoplasm: Secondary | ICD-10-CM

## 2014-11-11 MED ORDER — METOPROLOL SUCCINATE ER 25 MG PO TB24: 12.5000 mg | ORAL_TABLET | Freq: Every day | ORAL | Status: DC

## 2014-11-11 NOTE — Progress Notes (Signed)
Pre visit review using our clinic review tool, if applicable. No additional management support is needed unless otherwise documented below in the visit note. 

## 2014-11-11 NOTE — Assessment & Plan Note (Signed)
DM: Last A1c definitely improved, doing well with diet, check a cholesterol panel Atrial fibrillation: Seems to be in sinus rhythm, continue aspirin, restart beta blocker, will prescribe half his normal dose ( Toprol xl  25   half tablet daily) as BP and HR are very good today. See instructions Primary care: Flu shot today. Check a PSA

## 2014-11-11 NOTE — Progress Notes (Signed)
Subjective:    Patient ID: Sean Frye, male    DOB: Feb 25, 1951, 64 y.o.   MRN: [id number]  DOS:  11/11/2014 Type of visit - description : Routine visit, here with a translator Interval history: Feeling well, no concerns, not taking metoprolol, states he ran out.   Review of Systems Diet has definitely improved. No chest pain, difficulty breathing, lower extremity edema or palpitations No nausea, vomiting, diarrhea  Past Medical History  Diagnosis Date  . Prediabetes   . ED (erectile dysfunction) 06/10/2014    Past Surgical History  Procedure Laterality Date  . Lipoma removal    . Cholecystectomy  2005  . Tee without cardioversion N/A 05/14/2014    Procedure: TRANSESOPHAGEAL ECHOCARDIOGRAM (TEE);  Surgeon: Chrystie Nose, MD;  Location[facility]COPY;  Service: Cardiovascular;  Laterality: N/A;  . Cardioversion N/A 05/14/2014    Procedure: CARDIOVERSION;  Surgeon: Chrystie Nose, MD;  Location: [facility];  Service: Cardiovascular;  Laterality: N/A;    Social History   Social History  . Marital Status: Single    Spouse Name: N/A  . Number of Children: 5   . Years of Education: N/A   Occupational History  . Barrister's clerk, [employer]    Social History Main Topics  . Smoking status: Never Smoker   . Smokeless tobacco: Never Used  . Alcohol Use: No  . Drug Use: No  . Sexual Activity: Not on file   Other Topics Concern  . Not on file   Social History Narrative   Original from [place]   Lives w/ Mrs Darcel Bayley   Former professional soccer player        Medication List       This list is accurate as of: 11/11/14  7:28 PM.  Always use your most recent med list.               aspirin 81 MG tablet  Take 81 mg by mouth daily.     fluticasone 27.5 MCG/SPRAY nasal spray  Commonly known as:  VERAMYST  Place 2 sprays into the nose daily.     metoprolol succinate 25 MG 24 hr tablet  Commonly known as:  TOPROL XL  Take 0.5 tablets (12.5 mg total) by  mouth daily.     sildenafil 20 MG tablet  Commonly known as:  REVATIO  Take 1-2 tablets (20-40 mg total) by mouth as needed.           Objective:   Physical Exam BP 126/78 mmHg  Pulse 64  Temp(Src) 98.3 F (36.8 C) (Oral)  Ht  (1.727 m)  Wt 227 lb 8 oz (103.193 kg)  BMI 34.60 kg/m2  SpO2 98% General:   Well developed, well nourished . NAD.  HEENT:  Normocephalic . Face symmetric, atraumatic Lungs:  CTA B Normal respiratory effort, no intercostal retractions, no accessory muscle use. Heart: RRR,  no murmur.  No pretibial edema bilaterally  Skin: Not pale. Not jaundice Neurologic:  alert & oriented X3.  Speech normal, gait appropriate for age and unassisted Psych--  Cognition and judgment appear intact.  Cooperative with normal attention span and concentration.  Behavior appropriate. No anxious or depressed appearing.      Assessment & Plan:   Problem list > Diabetes --dx 03-2014 A1C 6.7 Atrial fibrillation: DX late 2015, cardioversion 04-2014, s/p xarelto , on ASA Erectile dysfunction: Related to beta blockers?  A/P DM: Last A1c definitely improved, doing well with diet, check a cholesterol panel Atrial  fibrillation: Seems to be in sinus rhythm, continue aspirin, restart beta blocker, will prescribe half his normal dose ( Toprol xl  25   half tablet daily) as BP and HR are very good today. See instructions Primary care: Flu shot today. Check a PSA

## 2014-11-11 NOTE — Patient Instructions (Signed)
  Please schedule labs to be done within few days (fasting)  Take metoprolol 1/2 tablet a day If you develop dizziness, weakness: stop metoprolol and call us      Next visit  for a  Physical exam in 5 months    Please schedule an appointment at the front desk Please come back fasting

## 2014-11-16 ENCOUNTER — Encounter: Payer: Self-pay | Admitting: Internal Medicine

## 2014-11-16 ENCOUNTER — Other Ambulatory Visit (INDEPENDENT_AMBULATORY_CARE_PROVIDER_SITE_OTHER): Payer: 59

## 2014-11-16 DIAGNOSIS — Z125 Encounter for screening for malignant neoplasm of prostate: Secondary | ICD-10-CM | POA: Diagnosis not present

## 2014-11-16 DIAGNOSIS — Z114 Encounter for screening for human immunodeficiency virus [HIV]: Secondary | ICD-10-CM

## 2014-11-16 DIAGNOSIS — E785 Hyperlipidemia, unspecified: Secondary | ICD-10-CM

## 2014-11-16 DIAGNOSIS — Z1159 Encounter for screening for other viral diseases: Secondary | ICD-10-CM

## 2014-11-16 DIAGNOSIS — E119 Type 2 diabetes mellitus without complications: Secondary | ICD-10-CM

## 2014-11-16 LAB — LIPID PANEL
CHOL/HDL RATIO: 5
CHOLESTEROL: 187 mg/dL (ref 0–200)
HDL: 34.5 mg/dL — AB (ref >39.00)
LDL Cholesterol: 113 mg/dL — ABNORMAL HIGH (ref 0–99)
NonHDL: 152.16
TRIGLYCERIDES: 196 mg/dL — AB (ref 0.0–149.0)
VLDL: 39.2 mg/dL (ref 0.0–40.0)

## 2014-11-16 LAB — PSA: PSA: 1.53 ng/mL (ref 0.10–4.00)

## 2014-11-17 LAB — HEPATITIS C ANTIBODY: HCV Ab: NEGATIVE

## 2014-11-17 LAB — HIV ANTIBODY (ROUTINE TESTING W REFLEX): HIV 1&2 Ab, 4th Generation: NONREACTIVE

## 2014-11-18 MED ORDER — ATORVASTATIN CALCIUM 10 MG PO TABS: 10.0000 mg | ORAL_TABLET | Freq: Every day | ORAL | Status: DC

## 2014-11-18 NOTE — Addendum Note (Signed)
Addended by: Dorette Grate on: 11/18/2014 03:18 PM   Modules accepted: Orders

## 2014-12-16 ENCOUNTER — Encounter: Payer: Self-pay | Admitting: Internal Medicine

## 2014-12-16 ENCOUNTER — Ambulatory Visit (INDEPENDENT_AMBULATORY_CARE_PROVIDER_SITE_OTHER): Payer: 59 | Admitting: Internal Medicine

## 2014-12-16 VITALS — BP 112/72 | HR 66 | Ht 71.0 in | Wt 227.5 lb

## 2014-12-16 DIAGNOSIS — I4891 Unspecified atrial fibrillation: Secondary | ICD-10-CM

## 2014-12-16 DIAGNOSIS — N528 Other male erectile dysfunction: Secondary | ICD-10-CM | POA: Diagnosis not present

## 2014-12-16 DIAGNOSIS — E785 Hyperlipidemia, unspecified: Secondary | ICD-10-CM | POA: Insufficient documentation

## 2014-12-16 MED ORDER — METOPROLOL SUCCINATE ER 25 MG PO TB24: 12.5000 mg | ORAL_TABLET | Freq: Every day | ORAL | Status: DC

## 2014-12-16 NOTE — Progress Notes (Signed)
OFFICE NOTE  Chief Complaint:  Routine follow-up, left heel pain   Physician: Willow OraJose Paz, MD  HPI:  Sean Frye is a pleasant 64 year old male who is coming referred to me for evaluation of atrial fibrillation. Apparently he had an EKG in August of this past year which demonstrated A. fib and was referred to cardiology, but follow-up was never made. He was scheduled to see me in December but had to travel to [place] for family. He recently saw his primary care provider and was again noted to be in A. fib. He was started on aspirin which she's been taking for 2 weeks. He is [id number] with no cardiac risk factors therefore has a low CHADSVASC score of 0-1. He is totally unaware of his atrial fibrillation. He denies any worsening shortness of breath, fatigue or exercise intolerance. He also denies any chest pain. He continues to do some exercise and some weight lifting without any limitations. He reports good sleep at night and denies snoring or any signs of sleep apnea. There is no history that he is aware of of A. fib in the family.  I recommended TEE cardioversion. He did undergo a TEE which showed no evidence of left atrial thrombus and normal LV function. There is mild valvular disease but nothing of concern. He underwent successful cardioversion and has been maintaining sinus rhythm. He says that he's feeling better and has more energy. He discontinued his Xarelto when he ran out of his prescription yesterday. He's also run out of metoprolol 3 days ago. He was not under the impression that he needed to maintain these medications. We had a long discussion today with the help of the Spanish translator, and I made it clear that he should stay on metoprolol to try to help keep him from going out of rhythm. However, because of his low risk for stroke, at this point I think he can be on daily aspirin. Xarelto was fairly expensive. There isn't clear data that he needs to be on it with his low CHADSVASC score  now one month post cardioversion. Should he have recurrence of A. fib, however he may need to go back on more significant anticoagulation.   Sean Frye returns today for follow-up. Overall he is doing well. He's had no recurrent A. fib. EKG today sinus rhythm. He's currently on aspirin. He reports some ongoing problems with erectile dysfunction. He is currently using 120 mg tablet of sildenafil. I reiterated that he could use up to 3 tablets as needed. He remains on low-dose metoprolol for rate control and blood pressure which is tolerating. He was recently started on low-dose atorvastatin for cholesterol.   PMHx:  Past Medical History  Diagnosis Date  . Prediabetes   . ED (erectile dysfunction) 06/10/2014    Past Surgical History  Procedure Laterality Date  . Lipoma removal    . Cholecystectomy  2005  . Tee without cardioversion N/A 05/14/2014    Procedure: TRANSESOPHAGEAL ECHOCARDIOGRAM (TEE);  Surgeon: Chrystie NoseKenneth C Jestine Bicknell, MD;  [facility];  Service: Cardiovascular;  Laterality: N/A;  . Cardioversion N/A 05/14/2014    Procedure: CARDIOVERSION;  Surgeon: Chrystie NoseKenneth C Monick Rena, MD;  [facility];  Service: Cardiovascular;  Laterality: N/A;    FAMHx:  Family History  Problem Relation Age of Onset  . CAD Neg Hx   . Diabetes Sister   . Diabetes Mother     cirrhosis  . Colon cancer Neg Hx   . Prostate cancer Father     ?  of cancer     SOCHx:   reports that he has never smoked. He has never used smokeless tobacco. He reports that he does not drink alcohol or use illicit drugs.  ALLERGIES:  No Known Allergies  ROS: A comprehensive review of systems was negative.  HOME MEDS: Current Outpatient Prescriptions  Medication Sig Dispense Refill  . aspirin 81 MG tablet Take 81 mg by mouth daily.    Marland Kitchen atorvastatin (LIPITOR) 10 MG tablet Take 1 tablet (10 mg total) by mouth at bedtime. 30 tablet 3  . fluticasone (VERAMYST) 27.5 MCG/SPRAY nasal spray Place 2 sprays into the  nose daily.    . metoprolol succinate (TOPROL XL) 25 MG 24 hr tablet Take 0.5 tablets (12.5 mg total) by mouth daily. 45 tablet 3  . sildenafil (REVATIO) 20 MG tablet Take 1-2 tablets (20-40 mg total) by mouth as needed. 50 tablet 0   No current facility-administered medications for this visit.    LABS/IMAGING: No results found for this or any previous visit (from the past 48 hour(s)). No results found.  VITALS: BP 112/72 mmHg  Pulse 66  Ht  (1.803 m)  Wt 227 lb 8 oz (103.193 kg)  BMI 31.74 kg/m2  EXAM: General appearance: alert and no distress Lungs: clear to auscultation bilaterally Heart: regular rate and rhythm, S1, S2 normal, no murmur, click, rub or gallop Extremities: extremities normal, atraumatic, no cyanosis or edema  EKG: Sinus rhythm at 66  ASSESSMENT: 1. A. fib with mild RVR - maintaining sinus after cardioversion 2. Low CHADSVASC score of 0-1 3. Erectile dysfunction 4. Dyslipidemia   PLAN: 1.   Sean Frye to be free from atrial fibrillation. Given a low CHADSVASC score, we'll continue him on low-dose aspirin indefinitely. He should also stay on low-dose metoprolol which may help prevent the recurrence of A. fib and control rate. I agree with the addition of Lipitor per his primary care provider. He seems to have some benefit with sildenafil for erectile dysfunction, but could take an extra pill as necessary. Overall he seems to be doing really well. Although enjoying seeing him as a patient, he has no clear active cardiac issues at this time. I'll return his care back to Dr. Drue Novel and follow-up with me can be on an as-needed basis, especially if he were to have recurrent A. fib.  Chrystie Nose, MD, [facility] Attending Cardiologist [facility]  Chrystie Nose 12/16/2014, 4:26 PM

## 2014-12-16 NOTE — Patient Instructions (Signed)
Your physician recommends that you schedule a follow-up appointment as needed  

## 2014-12-23 ENCOUNTER — Telehealth: Payer: Self-pay | Admitting: Internal Medicine

## 2014-12-23 ENCOUNTER — Ambulatory Visit: Payer: 59 | Admitting: Physician Assistant

## 2014-12-23 ENCOUNTER — Encounter: Payer: Self-pay | Admitting: Internal Medicine

## 2014-12-23 ENCOUNTER — Ambulatory Visit (INDEPENDENT_AMBULATORY_CARE_PROVIDER_SITE_OTHER): Payer: 59 | Admitting: Internal Medicine

## 2014-12-23 VITALS — BP 128/66 | HR 73 | Temp 98.1°F | Ht 71.0 in | Wt 227.2 lb

## 2014-12-23 DIAGNOSIS — R51 Headache: Secondary | ICD-10-CM | POA: Diagnosis not present

## 2014-12-23 DIAGNOSIS — Z09 Encounter for follow-up examination after completed treatment for conditions other than malignant neoplasm: Secondary | ICD-10-CM

## 2014-12-23 DIAGNOSIS — R519 Headache, unspecified: Secondary | ICD-10-CM

## 2014-12-23 MED ORDER — HYDROCODONE-ACETAMINOPHEN 5-325 MG PO TABS: 1.0000 | ORAL_TABLET | Freq: Three times a day (TID) | ORAL | Status: DC | PRN

## 2014-12-23 MED ORDER — PREDNISONE 10 MG PO TABS: ORAL_TABLET | ORAL | Status: DC

## 2014-12-23 MED ORDER — AMOXICILLIN 500 MG PO CAPS: 1000.0000 mg | ORAL_CAPSULE | Freq: Two times a day (BID) | ORAL | Status: DC

## 2014-12-23 NOTE — Progress Notes (Signed)
Pre visit review using our clinic review tool, if applicable. No additional management support is needed unless otherwise documented below in the visit note. 

## 2014-12-23 NOTE — Telephone Encounter (Signed)
Relation to ZO:XWRUpt:self Call back number: [phone]   Reason for call:  Patient in need of clarification regarding frequency for predniSONE (DELTASONE) 10 MG tablet. Sig: 4 tablets x 2 days, 3 tabs x 2 days, 2 tabs x 2 days, 1 tab x 2 days. Patient speaks spanish very little english . Annice Pih(jackie is out to lunch)

## 2014-12-23 NOTE — Progress Notes (Signed)
Subjective:    Patient ID: Sean BandaHector E Maher, male    DOB: 12/18/1950, 64 y.o.   MRN: [id number]  DOS:  12/23/2014 Type of visit - description : Acute visit, CC "sinusitis " Interval history: Symptoms started 3 days ago with pain at the left forehead, intense at times, present most of the time, no radiation. Described as "just pain" or burning. Had some nausea  today. Had to leave work this morning because of the pain. Not taking any specific medication. When I mention this possibly could be something other than that sinusitis the patient was quite convinced that he has a "sinus problem".   Review of Systems No fever or chills. Mild sinus congestion, very small amount of nasal discharge, at times green. No cough, sputum production or chest congestion No dizziness, diplopia, slurred speech or motor deficits No photophobia. No rash.  Past Medical History  Diagnosis Date  . Prediabetes   . ED (erectile dysfunction) 06/10/2014    Past Surgical History  Procedure Laterality Date  . Lipoma removal    . Cholecystectomy  2005  . Tee without cardioversion N/A 05/14/2014    Procedure: TRANSESOPHAGEAL ECHOCARDIOGRAM (TEE);  Surgeon: Chrystie NoseKenneth C Hilty, MD;  [facility];  Service: Cardiovascular;  Laterality: N/A;  . Cardioversion N/A 05/14/2014    Procedure: CARDIOVERSION;  Surgeon: Chrystie NoseKenneth C Hilty, MD;  [facility];  Service: Cardiovascular;  Laterality: N/A;    Social History   Social History  . Marital Status: Single    Spouse Name: N/A  . Number of Children: 5   . Years of Education: N/A   Occupational History  . Barrister's clerkairplane mechanic, [employer]    Social History Main Topics  . Smoking status: Never Smoker   . Smokeless tobacco: Never Used  . Alcohol Use: No  . Drug Use: No  . Sexual Activity: Not on file   Other Topics Concern  . Not on file   Social History Narrative   Original from [place]   Lives w/ Mrs Darcel Bayleyrma   Former professional soccer player          Medication List       This list is accurate as of: 12/23/14  5:49 PM.  Always use your most recent med list.               amoxicillin 500 MG capsule  Commonly known as:  AMOXIL  Take 2 capsules (1,000 mg total) by mouth 2 (two) times daily.     aspirin 81 MG tablet  Take 81 mg by mouth daily.     atorvastatin 10 MG tablet  Commonly known as:  LIPITOR  Take 1 tablet (10 mg total) by mouth at bedtime.     fluticasone 27.5 MCG/SPRAY nasal spray  Commonly known as:  VERAMYST  Place 2 sprays into the nose daily.     HYDROcodone-acetaminophen 5-325 MG tablet  Commonly known as:  NORCO/VICODIN  Take 1 tablet by mouth every 8 (eight) hours as needed.     metoprolol succinate 25 MG 24 hr tablet  Commonly known as:  TOPROL XL  Take 0.5 tablets (12.5 mg total) by mouth daily.     predniSONE 10 MG tablet  Commonly known as:  DELTASONE  4 tablets x 2 days, 3 tabs x 2 days, 2 tabs x 2 days, 1 tab x 2 days     sildenafil 20 MG tablet  Commonly known as:  REVATIO  Take 1-2 tablets (20-40 mg total) by  mouth as needed.           Objective:   Physical Exam  HENT:  Head:     BP 128/66 mmHg  Pulse 73  Temp(Src) 98.1 F (36.7 C) (Oral)  Ht  (1.803 m)  Wt 227 lb 4 oz (103.08 kg)  BMI 31.71 kg/m2  SpO2 98% General:   Well developed, well nourished . NAD.  HEENT:  Normocephalic . Face symmetric, atraumatic, no rash. TMs normal, nose not congested, sinuses not TTP. Throat symmetric. EOMI, anterior chambers normal, pupils equal and reactive. No conjunctival erythema  Lungs:  CTA B Normal respiratory effort, no intercostal retractions, no accessory muscle use. Heart: RRR,  no murmur.  No pretibial edema bilaterally  Skin: Not pale. Not jaundice Neurologic:  alert & oriented X3.  Speech normal, gait appropriate for age and unassisted. Motor exam and DTRs: Symmetric Psych--  Cognition and judgment appear intact.  Cooperative with normal attention span  and concentration.  Behavior appropriate. No anxious or depressed appearing.      Assessment & Plan:   Assessment > Diabetes --dx 03-2014 A1C 6.7 Atrial fibrillation: DX late 2015, cardioversion 04-2014, s/p xarelto , on ASA Erectile dysfunction: Related to beta blockers?  Plan Left facial (forehead) pain: Presents with three-day history of pain as described above, patient is convinced he has sinusitis, I'm not sure, DDX includes a migraine HA, neuralgia. Plan: Treat as sinusitis with amoxicillin, prednisone, and hydrocodone (patient described the pain is intense). If he is not improving will call and will consider CT sinus and further eval.

## 2014-12-23 NOTE — Telephone Encounter (Signed)
Forwarded to [place], whom translated to Pt that he is to take 4 tablets by mouth for 2 days, 3 tablets by mouth for 2 days, etc...etc..until medication is gone.

## 2014-12-23 NOTE — Assessment & Plan Note (Signed)
Left facial (forehead) pain: Presents with three-day history of pain as described above, patient is convinced he has sinusitis, I'm not sure, DDX includes a migraine HA, neuralgia. Plan: Treat as sinusitis with amoxicillin, prednisone, and hydrocodone (patient described the pain is intense). If he is not improving will call and will consider CT sinus and further eval.

## 2014-12-23 NOTE — Telephone Encounter (Signed)
Informed pt the below and pt understood. Thanks

## 2014-12-23 NOTE — Patient Instructions (Signed)
Rest, fluids , tylenol   Use OTC Nasocort or Flonase : 2 nasal sprays on each side of the nose daily until you feel better  Prednisone as prescribed for few days    For pain: Take Tylenol or ibuprofen, if the pain is not better, take hydrocodone. Will cause drowsiness  Take the antibiotic as prescribed  (Amoxicillin)  Call if not gradually better over the next  10 days  Call anytime if the symptoms are severe

## 2014-12-29 ENCOUNTER — Ambulatory Visit: Payer: 59 | Admitting: Internal Medicine

## 2015-01-18 ENCOUNTER — Other Ambulatory Visit (INDEPENDENT_AMBULATORY_CARE_PROVIDER_SITE_OTHER): Payer: 59

## 2015-01-18 DIAGNOSIS — E785 Hyperlipidemia, unspecified: Secondary | ICD-10-CM | POA: Diagnosis not present

## 2015-01-18 LAB — LIPID PANEL
Cholesterol: 193 mg/dL (ref 0–200)
HDL: 34.4 mg/dL — AB (ref >39.00)
NonHDL: 158.9
TRIGLYCERIDES: 210 mg/dL — AB (ref 0.0–149.0)
Total CHOL/HDL Ratio: 6
VLDL: 42 mg/dL — AB (ref 0.0–40.0)

## 2015-01-18 LAB — LDL CHOLESTEROL, DIRECT: Direct LDL: 138 mg/dL

## 2015-01-18 LAB — ALT: ALT: 36 U/L (ref 0–53)

## 2015-01-18 LAB — AST: AST: 21 U/L (ref 0–37)

## 2015-01-20 MED ORDER — ATORVASTATIN CALCIUM 20 MG PO TABS: 20.0000 mg | ORAL_TABLET | Freq: Every day | ORAL | Status: DC

## 2015-01-20 NOTE — Addendum Note (Signed)
Addended by: Dorette GrateFAULKNER, Diane Mochizuki C on: 01/20/2015 12:09 PM   Modules accepted: Orders, Medications

## 2015-01-29 ENCOUNTER — Other Ambulatory Visit: Payer: Self-pay

## 2015-01-29 ENCOUNTER — Telehealth: Payer: Self-pay | Admitting: Internal Medicine

## 2015-01-29 MED ORDER — ATORVASTATIN CALCIUM 20 MG PO TABS: 20.0000 mg | ORAL_TABLET | Freq: Every day | ORAL | Status: DC

## 2015-01-29 NOTE — Telephone Encounter (Signed)
Caller name: Sean Frye  Relation to pt: self  Call back number: [phone] Pharmacy:[facility] [address]  Reason for call: Pt states did blood work on 01-18-15 and still has not receive his lab result, looked up his address and it was missing [address] on the address, so pt would like to have is lab result sent to him again to the right address and pt is also wanted to get rx for metoprolol succinate (TOPROL XL) 25 MG 24 hr tablet, pt states only has meds for today left. Please advise.

## 2015-01-29 NOTE — Telephone Encounter (Signed)
Letter re-printed and mailed to Pt. Dr. Rennis GoldenHilty covers Pt's Metoprolol, he sent refills on 12/16/2014 #45 tablets (3 month supply) and [facility] [address].

## 2015-01-29 NOTE — Telephone Encounter (Signed)
Spoke with pt and explained the below, pt understood and will call pharmacy.

## 2015-04-28 ENCOUNTER — Telehealth: Payer: Self-pay

## 2015-04-28 NOTE — Telephone Encounter (Signed)
Called patient to update chart information and to remind of appointment scheduled with Dr. Drue Novel 04/29/15. Left message for call back.

## 2015-04-29 ENCOUNTER — Encounter: Payer: Self-pay | Admitting: Internal Medicine

## 2015-07-30 ENCOUNTER — Telehealth: Payer: Self-pay | Admitting: Internal Medicine

## 2015-07-30 ENCOUNTER — Ambulatory Visit (INDEPENDENT_AMBULATORY_CARE_PROVIDER_SITE_OTHER): Payer: BLUE CROSS/BLUE SHIELD | Admitting: Internal Medicine

## 2015-07-30 ENCOUNTER — Encounter: Payer: Self-pay | Admitting: Internal Medicine

## 2015-07-30 VITALS — BP 122/82 | HR 95 | Temp 98.1°F | Ht 71.0 in | Wt 222.2 lb

## 2015-07-30 DIAGNOSIS — E118 Type 2 diabetes mellitus with unspecified complications: Secondary | ICD-10-CM | POA: Diagnosis not present

## 2015-07-30 DIAGNOSIS — R7303 Prediabetes: Secondary | ICD-10-CM | POA: Diagnosis not present

## 2015-07-30 DIAGNOSIS — E785 Hyperlipidemia, unspecified: Secondary | ICD-10-CM

## 2015-07-30 DIAGNOSIS — I4891 Unspecified atrial fibrillation: Secondary | ICD-10-CM | POA: Diagnosis not present

## 2015-07-30 LAB — HEMOGLOBIN A1C
HEMOGLOBIN A1C: 6.4 % — AB (ref <5.7)
Mean Plasma Glucose: 137 mg/dL

## 2015-07-30 MED ORDER — METOPROLOL SUCCINATE ER 25 MG PO TB24: 12.5000 mg | ORAL_TABLET | Freq: Every day | ORAL | Status: DC

## 2015-07-30 MED ORDER — RIVAROXABAN 20 MG PO TABS: 20.0000 mg | ORAL_TABLET | Freq: Every day | ORAL | Status: DC

## 2015-07-30 MED ORDER — ATORVASTATIN CALCIUM 20 MG PO TABS: 20.0000 mg | ORAL_TABLET | Freq: Every day | ORAL | Status: DC

## 2015-07-30 NOTE — Progress Notes (Signed)
Pre visit review using our clinic review tool, if applicable. No additional management support is needed unless otherwise documented below in the visit note. 

## 2015-07-30 NOTE — Assessment & Plan Note (Signed)
DM: Again told patient he has diabetes, reports he is doing well with diet, will check a BMP, A1c and micro. On diet control. High cholesterol: Lipitor dose increase 12-2014, will check a FLP although he is not fasting. H/o atrial fibrillation, asx, on aspirin and low-dose beta blocker.  Pulse was irregular, EKG today confirmed  atrial fibrillation. Discussed with the patient the need for Xarelto to prevent strokes. Will prescribe Xarelto, d/c asa, refer to cardiology. Case was also discussed with DOD who agrees with the plan. Heel pain: Recommend stretching, avoid NSAIDs Chronic rhinitis: Flonase . [place], advice is extremely important he sees cardiology as recommended. Needs to make every effort to come back to be seen RTC 3 months

## 2015-07-30 NOTE — Progress Notes (Signed)
Subjective:    Patient ID: Sean Frye, male    DOB: 1950-09-06, 65 y.o.   MRN: [id number]  DOS:  07/30/2015 Type of visit - description : Acute visit (will also manage chronic problems) Interval history:  Complaining of left heel pain, he was seen elsewhere in January 2017, had a local injection and request another one. He still hurts some. Chronic problems with nasal discharge, postnasal dripping. High cholesterol: Good compliance with Lipitor. H/o Atrial fibrillation,: asx, on aspirin and beta blockers.   Review of Systems Denies chest pain or difficulty breathing. No palpitations No nausea, vomiting, diarrhea. No GERD symptoms.  Past Medical History  Diagnosis Date  . Prediabetes   . ED (erectile dysfunction) 06/10/2014  . Atrial fibrillation (HCC) 2015    Past Surgical History  Procedure Laterality Date  . Lipoma removal    . Cholecystectomy  2005  . Tee without cardioversion N/A 05/14/2014    Procedure: TRANSESOPHAGEAL ECHOCARDIOGRAM (TEE);  Surgeon: Chrystie Nose, MD;  Location: [facility] ENDOSCOPY;  Service: Cardiovascular;  Laterality: N/A;  . Cardioversion N/A 05/14/2014    Procedure: CARDIOVERSION;  Surgeon: Chrystie Nose, MD;  Location: [facility] ENDOSCOPY;  Service: Cardiovascular;  Laterality: N/A;    Social History   Social History  . Marital Status: Single    Spouse Name: N/A  . Number of Children: 5   . Years of Education: N/A   Occupational History  . Barrister's clerk, [employer]    Social History Main Topics  . Smoking status: Never Smoker   . Smokeless tobacco: Never Used  . Alcohol Use: No  . Drug Use: No  . Sexual Activity: Not on file   Other Topics Concern  . Not on file   Social History Narrative   Original from [place]   Lives w/ Mrs Darcel Bayley   Former professional soccer player        Medication List       This list is accurate as of: 07/30/15  5:34 PM.  Always use your most recent med list.               atorvastatin 20 MG tablet    Commonly known as:  LIPITOR  Take 1 tablet (20 mg total) by mouth daily.     metoprolol succinate 25 MG 24 hr tablet  Commonly known as:  TOPROL XL  Take 0.5 tablets (12.5 mg total) by mouth daily.     rivaroxaban 20 MG Tabs tablet  Commonly known as:  XARELTO  Take 1 tablet (20 mg total) by mouth daily with supper.           Objective:   Physical Exam BP 122/82 mmHg  Pulse 95  Temp(Src) 98.1 F (36.7 C) (Oral)  Ht  (1.803 m)  Wt 222 lb 4 oz (100.812 kg)  BMI 31.01 kg/m2  SpO2 99% General:   Well developed, well nourished . NAD.  HEENT:  Normocephalic . Face symmetric, atraumatic Lungs:  CTA B Normal respiratory effort, no intercostal retractions, no accessory muscle use. Heart: regular?,  no murmur.  No pretibial edema bilaterally  Skin: Not pale. Not jaundice Neurologic:  alert & oriented X3.  Speech normal, gait appropriate for age and unassisted Psych--  Cognition and judgment appear intact.  Cooperative with normal attention span and concentration.  Behavior appropriate. No anxious or depressed appearing.      Assessment & Plan:   Assessment > Diabetes --dx 03-2014 A1C 6.7 Atrial fibrillation: DX late 2015, cardioversion  04-2014, s/p xarelto , on ASA indefinitely, RTC cards prn Erectile dysfunction: Related to beta blockers? Chronic rhinitis  PLAN: DM: Again told patient he has diabetes, reports he is doing well with diet, will check a BMP, A1c and micro. On diet control. High cholesterol: Lipitor dose increase 12-2014, will check a FLP although he is not fasting. H/o atrial fibrillation, asx, on aspirin and low-dose beta blocker.  Pulse was irregular, EKG today confirmed  atrial fibrillation. Discussed with the patient the need for Xarelto to prevent strokes. Will prescribe Xarelto, d/c asa, refer to cardiology. Case was also discussed with DOD who agrees with the plan. Heel pain: Recommend stretching, avoid NSAIDs Chronic rhinitis: Flonase  . [place] in [place], advice is extremely important he sees cardiology as recommended. Needs to make every effort to come back to be seen RTC 3 months

## 2015-07-30 NOTE — Telephone Encounter (Signed)
I spoke with Dr. Drue NovelPaz. ECG reviewed; Recurrent AF. Agree with resuming xarelto.

## 2015-07-30 NOTE — Patient Instructions (Signed)
GO TO THE LAB : Get the blood work     GO TO THE FRONT DESK Schedule your next appointment for a  routine checkup in 4 months   You are back on atrial fibrillation: Stop aspirin, take Xarelto 1 tablet every night Watch for bleeding We are scheduling you to see one of our heart doscotrs in [place]. If problems, go up to your local emergency room  For heel pain, do not take ibuprofen, naproxen or any similar medication. Stretching and Tylenol are okay

## 2015-07-31 LAB — BASIC METABOLIC PANEL
BUN: 11 mg/dL (ref 7–25)
CALCIUM: 8.8 mg/dL (ref 8.6–10.3)
CO2: 26 mmol/L (ref 20–31)
CREATININE: 0.96 mg/dL (ref 0.70–1.25)
Chloride: 101 mmol/L (ref 98–110)
GLUCOSE: 105 mg/dL — AB (ref 65–99)
Potassium: 4 mmol/L (ref 3.5–5.3)
Sodium: 136 mmol/L (ref 135–146)

## 2015-07-31 LAB — LIPID PANEL
Cholesterol: 127 mg/dL (ref 125–200)
HDL: 32 mg/dL — ABNORMAL LOW (ref >=40)
LDL CALC: 64 mg/dL (ref <130)
TRIGLYCERIDES: 157 mg/dL — AB (ref <150)
Total CHOL/HDL Ratio: 4 Ratio (ref <=5.0)
VLDL: 31 mg/dL — AB (ref <30)

## 2015-07-31 LAB — AST: AST: 16 U/L (ref 10–35)

## 2015-07-31 LAB — MICROALBUMIN / CREATININE URINE RATIO
CREATININE, URINE: 145 mg/dL (ref 20–370)
MICROALB UR: 1.3 mg/dL
Microalb Creat Ratio: 9 mcg/mg creat (ref <30)

## 2015-07-31 LAB — ALT: ALT: 20 U/L (ref 9–46)

## 2015-09-03 ENCOUNTER — Encounter: Payer: Self-pay | Admitting: Internal Medicine

## 2015-09-03 ENCOUNTER — Ambulatory Visit (INDEPENDENT_AMBULATORY_CARE_PROVIDER_SITE_OTHER): Payer: BLUE CROSS/BLUE SHIELD | Admitting: Internal Medicine

## 2015-09-03 VITALS — BP 114/72 | HR 91 | Ht 71.0 in | Wt 221.0 lb

## 2015-09-03 DIAGNOSIS — I4891 Unspecified atrial fibrillation: Secondary | ICD-10-CM

## 2015-09-03 DIAGNOSIS — E785 Hyperlipidemia, unspecified: Secondary | ICD-10-CM

## 2015-09-03 MED ORDER — ASPIRIN EC 81 MG PO TBEC: 81.0000 mg | DELAYED_RELEASE_TABLET | Freq: Every day | ORAL | Status: DC

## 2015-09-03 MED ORDER — METOPROLOL SUCCINATE ER 25 MG PO TB24: 12.5000 mg | ORAL_TABLET | Freq: Every day | ORAL | Status: DC

## 2015-09-03 NOTE — Patient Instructions (Signed)
Medication Instructions:  STOP Xarelto Start Aspirin 81mg  take 1 tab by mouth daily  Refill of Metoprolol sent to pharmacy   Labwork: None  Testing/Procedures: None  Follow-Up: Your physician recommends that you schedule a follow-up appointment in: 1 year with Dr Rennis GoldenHilty   Any Other Special Instructions Will Be Listed Below (If Applicable).     If you need a refill on your cardiac medications before your next appointment, please call your pharmacy.

## 2015-09-03 NOTE — Progress Notes (Signed)
OFFICE NOTE  Chief Complaint:  Recurrent a-fib  Physician: Sean OraJose Paz, MD  HPI:  Sean Frye is a pleasant 65 year old male who is coming referred to me for evaluation of atrial fibrillation. Apparently he had an EKG in August of this past year which demonstrated A. fib and was referred to cardiology, but follow-up was never made. He was scheduled to see me in December but had to travel to [place] for family. He recently saw his primary care provider and was again noted to be in A. fib. He was started on aspirin which she's been taking for 2 weeks. He is [age] with no cardiac risk factors therefore has a low CHADSVASC score of 0-1. He is totally unaware of his atrial fibrillation. He denies any worsening shortness of breath, fatigue or exercise intolerance. He also denies any chest pain. He continues to do some exercise and some weight lifting without any limitations. He reports good sleep at night and denies snoring or any signs of sleep apnea. There is no history that he is aware of of A. fib in the family.  I recommended TEE cardioversion. He did undergo a TEE which showed no evidence of left atrial thrombus and normal LV function. There is mild valvular disease but nothing of concern. He underwent successful cardioversion and has been maintaining sinus rhythm. He says that he's feeling better and has more energy. He discontinued his Xarelto when he ran out of his prescription yesterday. He's also run out of metoprolol 3 days ago. He was not under the impression that he needed to maintain these medications. We had a long discussion today with the help of the Spanish translator, and I made it clear that he should stay on metoprolol to try to help keep him from going out of rhythm. However, because of his low risk for stroke, at this point I think he can be on daily aspirin. Xarelto was fairly expensive. There isn't clear data that he needs to be on it with his low CHADSVASC score now one month post  cardioversion. Should he have recurrence of A. fib, however he may need to go back on more significant anticoagulation.   Sean Frye returns today for follow-up. Overall he is doing well. He's had no recurrent A. fib. EKG today sinus rhythm. He's currently on aspirin. He reports some ongoing problems with erectile dysfunction. He is currently using 120 mg tablet of sildenafil. I reiterated that he could use up to 3 tablets as needed. He remains on low-dose metoprolol for rate control and blood pressure which is tolerating. He was recently started on low-dose atorvastatin for cholesterol.   09/03/2015  Sean Frye returns today for follow-up. He was referred back from Sean Frye after recent office visit in which she was noted to have an irregular rhythm. An EKG demonstrated he was back in atrial fibrillation, however he reports he has no symptoms of A. fib. In fact [place]'s [place] doing significant annual labor without any chest pain, shortness of breath or worsening fatigue. 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 Since he has had recurrence within a fairly short time, the likelihood of recurrent A. fib has increased going forward. We discussed options including repeat cardioversion since he's been on one month of Xarelto which was started by his primary care provider or consider continuing rate control and long-term anticoagulation. Again, I'm not certain that he has any  history of hypertension therefore his CHADSVASC score now is 0, but will go up to 1 when he turned 66. This would put his stroke risk summer between 0 and 1% annually. Based on current guidelines, it is reasonable to treat him with aspirin therapy which should lower his stroke risk to 0.5%.  PMHx:  Past Medical History  Diagnosis Date  . Prediabetes   . ED (erectile dysfunction) 06/10/2014  . Atrial fibrillation (HCC) 2015    Past Surgical  History  Procedure Laterality Date  . Lipoma removal    . Cholecystectomy  2005  . Tee without cardioversion N/A 05/14/2014    Procedure: TRANSESOPHAGEAL ECHOCARDIOGRAM (TEE);  Surgeon: Sean Nose, MD;  Location: [facility] ENDOSCOPY;  Service: Cardiovascular;  Laterality: N/A;  . Cardioversion N/A 05/14/2014    Procedure: CARDIOVERSION;  Surgeon: Sean Nose, MD;  Location: [facility] ENDOSCOPY;  Service: Cardiovascular;  Laterality: N/A;    FAMHx:  Family History  Problem Relation Age of Onset  . CAD Neg Hx   . Diabetes Sister   . Diabetes Mother     cirrhosis  . Colon cancer Neg Hx   . Prostate cancer Father     ? of cancer     SOCHx:   reports that he has never smoked. He has never used smokeless tobacco. He reports that he does not drink alcohol or use illicit drugs.  ALLERGIES:  No Known Allergies  ROS: A comprehensive review of systems was negative.  HOME MEDS: Current Outpatient Prescriptions  Medication Sig Dispense Refill  . atorvastatin (LIPITOR) 20 MG tablet Take 1 tablet (20 mg total) by mouth daily. 30 tablet 6  . metoprolol succinate (TOPROL XL) 25 MG 24 hr tablet Take 0.5 tablets (12.5 mg total) by mouth daily. 15 tablet 6  . rivaroxaban (XARELTO) 20 MG TABS tablet Take 1 tablet (20 mg total) by mouth daily with supper. 30 tablet 2   No current facility-administered medications for this visit.    LABS/IMAGING: No results found for this or any previous visit (from the past 48 hour(s)). No results found.  VITALS: BP 114/72 mmHg  Pulse 91  Ht  (1.803 m)  Wt 221 lb (100.245 kg)  BMI 30.84 kg/m2  EXAM: General appearance: alert and no distress Lungs: clear to auscultation bilaterally Heart: irregularly irregular rhythm Extremities: extremities normal, atraumatic, no cyanosis or edema  EKG: Atrial fibrillation at 91, left anterior fascicular block  ASSESSMENT: 1. Recurrent a-fib 2. CHADSVASC score of 0 3. Erectile dysfunction 4. Dyslipidemia    PLAN: 1.   Sean Frye is now back in atrial fibrillation. This was identified over a month ago and he was placed on Xarelto. Since he is absolutely asymptomatic which we covered in great detail today including using the [place] translator, I'm certain that he will not likely benefit from cardioversion and be very likely to develop A. fib again in the future without antiarrhythmic medications. Since he is asymptomatic, rate control is certainly a reasonable strategy. His stroke risk is very low and currently his CHADSVASC score 0. When he turns 66 is score goes to 1 which would increase his stroke risk to 1%. We can lower that stroke risk to 0.5%. For the time being I would recommend aspirin 81 mg daily. He reported that he had some stomach upset with Xarelto, therefore we will discontinue that. Hopefully he can stay on aspirin long-term and if he is free of other stroke risk factors, he should do well.  Follow-up  with me annually or sooner as necessary.  Sean NoseKenneth C. Jibran Crookshanks, MD, [facility] Attending Cardiologist [facility]  Sean NoseKenneth C Katya Rolston 09/03/2015, 9:14 AM

## 2015-09-16 ENCOUNTER — Ambulatory Visit: Payer: BLUE CROSS/BLUE SHIELD | Admitting: Internal Medicine

## 2015-10-29 ENCOUNTER — Ambulatory Visit (HOSPITAL_BASED_OUTPATIENT_CLINIC_OR_DEPARTMENT_OTHER)
Admission: RE | Admit: 2015-10-29 | Discharge: 2015-10-29 | Disposition: A | Discharge status: Home / Self Care | Payer: BLUE CROSS/BLUE SHIELD | Source: Ambulatory Visit | Attending: Internal Medicine | Admitting: Internal Medicine

## 2015-10-29 ENCOUNTER — Encounter: Payer: Self-pay | Admitting: Internal Medicine

## 2015-10-29 ENCOUNTER — Ambulatory Visit (INDEPENDENT_AMBULATORY_CARE_PROVIDER_SITE_OTHER): Payer: BLUE CROSS/BLUE SHIELD | Admitting: Internal Medicine

## 2015-10-29 VITALS — BP 108/68 | HR 91 | Temp 98.2°F | Resp 14 | Ht 71.0 in | Wt 221.4 lb

## 2015-10-29 DIAGNOSIS — J309 Allergic rhinitis, unspecified: Secondary | ICD-10-CM | POA: Diagnosis not present

## 2015-10-29 DIAGNOSIS — R042 Hemoptysis: Secondary | ICD-10-CM

## 2015-10-29 NOTE — Progress Notes (Signed)
Subjective:    Patient ID: Sean Frye, male    DOB: 1950/03/17, 65 y.o.   MRN: [id number]  DOS:  10/29/2015 Type of visit - description : Acute visit Interval history: For the last 4 days has noted that in the morning he needs to clear his throat and brings up saliva which has some specks of blood. Is not sure if the "saliva" is coming from the sinuses or chest but denies actual cough  He denies fever, chills, sore throat. No weight loss No sinus pain   He does have itchy nose and not persistent sinus congestion. Has noted a little sore on the right nostril >> it  is already better.  Review of Systems   Past Medical History:  Diagnosis Date  . Atrial fibrillation (HCC) 2015  . ED (erectile dysfunction) 06/10/2014  . Prediabetes     Past Surgical History:  Procedure Laterality Date  . CARDIOVERSION N/A 05/14/2014   Procedure: CARDIOVERSION;  Surgeon: Chrystie Nose, MD;  Location: [facility] ENDOSCOPY;  Service: Cardiovascular;  Laterality: N/A;  . CHOLECYSTECTOMY  2005  . lipoma removal    . TEE WITHOUT CARDIOVERSION N/A 05/14/2014   Procedure: TRANSESOPHAGEAL ECHOCARDIOGRAM (TEE);  Surgeon: Chrystie Nose, MD;  Location: [facility] ENDOSCOPY;  Service: Cardiovascular;  Laterality: N/A;    Social History   Social History  . Marital status: Single    Spouse name: N/A  . Number of children: 5   . Years of education: N/A   Occupational History  . Barrister's clerk, [employer]    Social History Main Topics  . Smoking status: Never Smoker  . Smokeless tobacco: Never Used  . Alcohol use No  . Drug use: No  . Sexual activity: Not on file   Other Topics Concern  . Not on file   Social History Narrative   Original from [place]   Lives w/ Mrs Darcel Bayley   Former professional soccer player        Medication List       Accurate as of 10/29/15 11:59 PM. Always use your most recent med list.          aspirin EC 81 MG tablet Take 1 tablet (81 mg total) by mouth daily.     atorvastatin 20 MG tablet Commonly known as:  LIPITOR Take 1 tablet (20 mg total) by mouth daily.   metoprolol succinate 25 MG 24 hr tablet Commonly known as:  TOPROL XL Take 0.5 tablets (12.5 mg total) by mouth daily.          Objective:   Physical Exam BP 108/68 (BP Location: Left Arm, Patient Position: Sitting, Cuff Size: Normal)   Pulse 91   Temp 98.2 F (36.8 C) (Oral)   Resp 14   Ht 5\' 11"  (1.803 m)   Wt 221 lb 6 oz (100.4 kg)   SpO2 97%   BMI 30.88 kg/m   General:   Well developed, well nourished . NAD.  HEENT:   Normocephalic . Face symmetric, atraumatic. TMs normal, nose inspected, no  lesions at the nostrils. Sinuses no TTP. Nose is slightly congested. Neck: No LAD, no thyromegaly Lungs:  CTA B Normal respiratory effort, no intercostal retractions, no accessory muscle use. Heart: RRR,  no murmur.  No pretibial edema bilaterally  Skin: Not pale. Not jaundice Neurologic:  alert & oriented X3.  Speech normal, gait appropriate for age and unassisted Psych--  Cognition and judgment appear intact.  Cooperative with normal attention span and concentration.  Behavior appropriate. No anxious or depressed appearing.      Assessment & Plan:    Assessment > Diabetes --dx 03-2014 A1C 6.7 Atrial fibrillation: DX late 2015, cardioversion 04-2014, s/p xarelto , on ASA indefinitely, RTC cards prn Erectile dysfunction: Related to beta blockers? Chronic rhinitis   PLAN: Allergic rhinitis: Patient reports mucus ("saliva") pooling in the throat in the morning, in the last 4 days. Some specks of blood. By history I doubt he is actually having hemoptysis but to be sure will get a chest x-ray. Otherwise recommend consistent use of Flonase. Also he reports some eye redness, exam is essentially negative, recommend Systane and call if no better. See  instructions.

## 2015-10-29 NOTE — Progress Notes (Signed)
Pre visit review using our clinic review tool, if applicable. No additional management support is needed unless otherwise documented below in the visit note. 

## 2015-10-29 NOTE — Patient Instructions (Addendum)
Flonase 2 sprays in each side of the nose every day for 2 weeks  Use eyedrops OTC such as Systane: 2 drops in each eye 3 times a day  Get a chest x-ray at the first floor  Schedule a office visit at your convenience  If you are not better in the next 2 weeks or you have fever chills: Call the office

## 2015-10-31 NOTE — Assessment & Plan Note (Signed)
Allergic rhinitis: Patient reports mucus ("saliva") pooling in the throat in the morning, in the last 4 days. Some specks of blood. By history I doubt he is actually having hemoptysis but to be sure will get a chest x-ray. Otherwise recommend consistent use of Flonase. Also he reports some eye redness, exam is essentially negative, recommend Systane and call if no better. See  instructions.

## 2015-11-19 ENCOUNTER — Ambulatory Visit: Payer: BLUE CROSS/BLUE SHIELD | Admitting: Internal Medicine

## 2015-11-22 ENCOUNTER — Telehealth: Payer: Self-pay | Admitting: Internal Medicine

## 2015-11-22 NOTE — Telephone Encounter (Signed)
Caller name: Oswaldo DoneHector  Relation to pt: self  Call back number: [phone] [facility] [place] [address]  Reason for call: Pt called indicating ate salami with cheese last night and is feeling not good with a stomach ache and headaches, pt was informed that needed to be seen by provider but pt stated is out of town and could not come in to see provider. Pt asked if provider can send a rx for the situation even informing him that he had to come to office. Pt insisted for me to send note to provider for a rx. If rx is sent pt [facility] [place]. Please advise.

## 2015-11-22 NOTE — Telephone Encounter (Signed)
Please advise 

## 2015-11-23 NOTE — Telephone Encounter (Signed)
LVM for pt to return call to give pt Dr Drue NovelPaz recommendation.

## 2015-11-23 NOTE — Telephone Encounter (Signed)
If he has indigestion, okay to try OTC omeprazole, I can't provide any further advise except to go to a urgent care if he has severe or persistent symptoms, fever, diarrhea, blood in the stools, nausea vomiting.

## 2015-11-23 NOTE — Telephone Encounter (Signed)
Ferdinand LangoJackie-can you please translate to Pt Dr. Drue NovelPaz below recommendations. Thank you.

## 2015-11-24 NOTE — Telephone Encounter (Signed)
Pt called and was informed the below and pt understood. Pt if having any other symptoms stated will go to urgent care but that at the moment he is ok and will try to buy omeprazole for his digestion.

## 2015-11-30 ENCOUNTER — Telehealth: Payer: Self-pay | Admitting: Internal Medicine

## 2015-11-30 DIAGNOSIS — L989 Disorder of the skin and subcutaneous tissue, unspecified: Secondary | ICD-10-CM

## 2015-11-30 NOTE — Telephone Encounter (Signed)
Referral placed.

## 2015-11-30 NOTE — Telephone Encounter (Signed)
Please advise 

## 2015-11-30 NOTE — Telephone Encounter (Signed)
Caller name: Sean Frye Relation to pt: self Call back number:[phone] Pharmacy:  Reason for call: Pt called requesting is needing a referral to go have a cyst (?) on left foot heel drained. Pt states already PCP was informed about issue but that did not have did done before since he was verifying if pt had any heart issue, but pt informed that everything is ok with his heart and would like to processed with draining his cyst from foot. Needs referral  ASAP. Please advise.

## 2015-11-30 NOTE — Telephone Encounter (Signed)
He has complained about heel pain but I review my recent notes and there is no mention of a cyst. If he has a cyst of the foot, refer to podiatry.

## 2015-12-03 ENCOUNTER — Ambulatory Visit (INDEPENDENT_AMBULATORY_CARE_PROVIDER_SITE_OTHER): Payer: BLUE CROSS/BLUE SHIELD | Admitting: Internal Medicine

## 2015-12-03 ENCOUNTER — Encounter: Payer: Self-pay | Admitting: Internal Medicine

## 2015-12-03 VITALS — BP 100/62 | HR 94 | Temp 97.6°F | Resp 16 | Ht 73.0 in | Wt 221.0 lb

## 2015-12-03 DIAGNOSIS — Z23 Encounter for immunization: Secondary | ICD-10-CM

## 2015-12-03 DIAGNOSIS — L989 Disorder of the skin and subcutaneous tissue, unspecified: Secondary | ICD-10-CM | POA: Diagnosis not present

## 2015-12-03 DIAGNOSIS — I4891 Unspecified atrial fibrillation: Secondary | ICD-10-CM

## 2015-12-03 DIAGNOSIS — E119 Type 2 diabetes mellitus without complications: Secondary | ICD-10-CM

## 2015-12-03 LAB — HEMOGLOBIN A1C
HEMOGLOBIN A1C: 6 % — AB (ref <5.7)
Mean Plasma Glucose: 126 mg/dL

## 2015-12-03 MED ORDER — ATORVASTATIN CALCIUM 20 MG PO TABS: 20.0000 mg | ORAL_TABLET | Freq: Every day | ORAL | 6 refills | Status: DC

## 2015-12-03 MED ORDER — METOPROLOL SUCCINATE ER 25 MG PO TB24: 12.5000 mg | ORAL_TABLET | Freq: Every day | ORAL | 1 refills | Status: DC

## 2015-12-03 MED ORDER — METOPROLOL SUCCINATE ER 25 MG PO TB24: 12.5000 mg | ORAL_TABLET | Freq: Every day | ORAL | 3 refills | Status: DC

## 2015-12-03 NOTE — Patient Instructions (Signed)
GO TO THE LAB : Get the blood work     GO TO THE FRONT DESK Schedule your next appointment for a  complete physical exam in 6 months  Schedule a nurse visit to check your blood pressure and heart rate in 6 weeks

## 2015-12-03 NOTE — Progress Notes (Signed)
Pre visit review using our clinic review tool, if applicable. No additional management support is needed unless otherwise documented below in the visit note. 

## 2015-12-03 NOTE — Progress Notes (Signed)
Subjective:    Patient ID: Sean Frye, male    DOB: July 30, 1950, 65 y.o.   MRN: [id number]  DOS:  12/03/2015 Type of visit - description : Routine visit Interval history: No major concerns. Had diarrhea recently, symptoms resolve. Had few episodes of pain in the left abdomen that also resolved. Rhinitis controlled as long as he uses Flonase  Review of Systems Denies fever chills. No chest pain or difficulty breathing No blood in the stools, dysuria or gross hematuria  Past Medical History:  Diagnosis Date  . Atrial fibrillation (HCC) 2015  . ED (erectile dysfunction) 06/10/2014  . Prediabetes     Past Surgical History:  Procedure Laterality Date  . CARDIOVERSION N/A 05/14/2014   Procedure: CARDIOVERSION;  Surgeon: Chrystie Nose, MD;  Location: [facility] ENDOSCOPY;  Service: Cardiovascular;  Laterality: N/A;  . CHOLECYSTECTOMY  2005  . lipoma removal    . TEE WITHOUT CARDIOVERSION N/A 05/14/2014   Procedure: TRANSESOPHAGEAL ECHOCARDIOGRAM (TEE);  Surgeon: Chrystie Nose, MD;  Location: [facility] ENDOSCOPY;  Service: Cardiovascular;  Laterality: N/A;    Social History   Social History  . Marital status: Single    Spouse name: N/A  . Number of children: 5   . Years of education: N/A   Occupational History  . Barrister's clerk, [employer]    Social History Main Topics  . Smoking status: Never Smoker  . Smokeless tobacco: Never Used  . Alcohol use No  . Drug use: No  . Sexual activity: Not on file   Other Topics Concern  . Not on file   Social History Narrative   Original from [place]   Lives w/ Mrs Darcel Bayley   Former professional soccer player        Medication List       Accurate as of 12/03/15  4:13 PM. Always use your most recent med list.          aspirin EC 81 MG tablet Take 1 tablet (81 mg total) by mouth daily.   atorvastatin 20 MG tablet Commonly known as:  LIPITOR Take 1 tablet (20 mg total) by mouth daily.   metoprolol succinate 25 MG 24 hr  tablet Commonly known as:  TOPROL XL Take 0.5 tablets (12.5 mg total) by mouth daily.          Objective:   Physical Exam BP 100/62 (BP Location: Right Arm, Patient Position: Sitting, Cuff Size: Normal)   Pulse 94   Temp 97.6 F (36.4 C) (Oral)   Resp 16   Ht 6\' 1"  (1.854 m)   Wt 221 lb (100.2 kg)   SpO2 97%   BMI 29.16 kg/m  General:   Well developed, well nourished . NAD.  HEENT:  Normocephalic . Face symmetric, atraumatic Lungs:  CTA B Normal respiratory effort, no intercostal retractions, no accessory muscle use. Heart: Irregularly irregular,  no murmur.  No pretibial edema bilaterally  Skin: Not pale. Not jaundice Neurologic:  alert & oriented X3.  Speech normal, gait appropriate for age and unassisted Psych--  Cognition and judgment appear intact.  Cooperative with normal attention span and concentration.  Behavior appropriate. No anxious or depressed appearing.      Assessment & Plan:   Assessment > Diabetes --dx 03-2014 A1C 6.7 Atrial fibrillation: DX late 2015 cardioversion 04-2014, s/p xarelto ,Rx ASA ; went back  on Afib again 2017, saw cards 08-2015: rx to stay on ASA and rate control Erectile dysfunction: Related to beta blockers? Chronic rhinitis  PLAN: Diabetes: Check A1c, on diet control Atrial fibrillation: Saw cardiology, [place] score 0, they Rx aspirin and rate control. Patient is asx. Heart rate 94 today, blood pressure 100/62. Recommend nurse visit in 6 weeks for BP and heart rate. Also,  L heel pain, was referred to podiatry but likes to see one in this bldg. Flu shot today RTC 6 months, CPX.

## 2015-12-05 NOTE — Assessment & Plan Note (Signed)
Diabetes: Check A1c, on diet control Atrial fibrillation: Saw cardiology, [place] score 0, they Rx aspirin and rate control. Patient is asx. Heart rate 94 today, blood pressure 100/62. Recommend nurse visit in 6 weeks for BP and heart rate. Also,  L heel pain, was referred to podiatry but likes to see one in this bldg. Flu shot today RTC 6 months, CPX.

## 2015-12-17 ENCOUNTER — Ambulatory Visit (INDEPENDENT_AMBULATORY_CARE_PROVIDER_SITE_OTHER): Payer: BLUE CROSS/BLUE SHIELD

## 2015-12-17 ENCOUNTER — Ambulatory Visit (INDEPENDENT_AMBULATORY_CARE_PROVIDER_SITE_OTHER): Payer: BLUE CROSS/BLUE SHIELD | Admitting: Podiatry

## 2015-12-17 ENCOUNTER — Encounter: Payer: Self-pay | Admitting: Podiatry

## 2015-12-17 VITALS — BP 157/111 | HR 98 | Resp 16

## 2015-12-17 DIAGNOSIS — M79672 Pain in left foot: Secondary | ICD-10-CM

## 2015-12-17 DIAGNOSIS — M722 Plantar fascial fibromatosis: Secondary | ICD-10-CM | POA: Diagnosis not present

## 2015-12-17 NOTE — Progress Notes (Addendum)
   Subjective:    Patient ID: Margit BandaHector E Markham, male    DOB: 1951/01/13, 65 y.o.   MRN: [id number]  HPI 65 year old male presents the also concerns of the left heel pain which is been ongoing for about 1 year. He states it hurts worse after being on his feet all day. He states that certain bottom of the heel does go back midpoint, Achilles tendon. He states it hurts me since Her sometimes he gets back up from ascending position which is relieved by activity. Denies any recent injury or trauma. No swelling or redness. This is been ongoing for about 1 year. No other treatment. No other complaints.   Review of Systems  All other systems reviewed and are negative.      Objective:   Physical Exam General: AAO x3, NAD  Dermatological: Skin is warm, dry and supple bilateral. Nails x 10 are well manicured; remaining integument appears unremarkable at this time. There are no open sores, no preulcerative lesions, no rash or signs of infection present.  Vascular: Dorsalis Pedis artery and Posterior Tibial artery pedal pulses are 2/4 bilateral with immedate capillary fill time. There is no pain with calf compression, swelling, warmth, erythema.   Neruologic: Grossly intact via light touch bilateral. Vibratory intact via tuning fork bilateral. Protective threshold with Semmes Wienstein monofilament intact to all pedal sites bilateral.  Musculoskeletal: Tenderness to palpation along the plantar medial tubercle of the calcaneus at the insertion of plantar fascia on the left foot. There is no pain along the course of the plantar fascia within the arch of the foot. Plantar fascia appears to be intact. There is no pain with lateral compression of the calcaneus or pain with vibratory sensation. There is no pain along the course or insertion of the achilles tendon. No other areas of tenderness to bilateral lower extremities. Muscular strength 5/5 in all groups tested bilateral.  Gait: Unassisted, Nonantalgic.       Assessment & Plan:  65 year old male left heel pain likely plantar fasciitis -Treatment options discussed including all alternatives, risks, and complications -Etiology of symptoms were discussed -X-rays were obtained and reviewed with the patient.  -Plantar fascial brace -Stretching and icing -Discussed shoegear modifications and orthotics -Discussed steroid injection -Follow-up in 4 weeks or sooner if any problems arise. In the meantime, encouraged to call the office with any questions, concerns, change in symptoms.   Ovid CurdMatthew Wagoner, DPM  *At this appointment the patient was scanned for [facility] and [facility].

## 2015-12-17 NOTE — Patient Instructions (Signed)

## 2016-01-14 ENCOUNTER — Encounter: Payer: BLUE CROSS/BLUE SHIELD | Admitting: Podiatry

## 2016-02-04 ENCOUNTER — Encounter: Payer: BLUE CROSS/BLUE SHIELD | Admitting: Podiatry

## 2016-02-10 ENCOUNTER — Ambulatory Visit (INDEPENDENT_AMBULATORY_CARE_PROVIDER_SITE_OTHER): Payer: BLUE CROSS/BLUE SHIELD | Admitting: Podiatry

## 2016-02-10 ENCOUNTER — Encounter: Payer: Self-pay | Admitting: Podiatry

## 2016-02-10 DIAGNOSIS — M722 Plantar fascial fibromatosis: Secondary | ICD-10-CM

## 2016-02-10 NOTE — Patient Instructions (Signed)

## 2016-02-10 NOTE — Progress Notes (Signed)
Dispensed patient's orthotics with oral and written instructions for wearing. Patient will follow up with Dr. Wagoner in 1 month for an orthotic check.  

## 2016-03-09 ENCOUNTER — Ambulatory Visit (INDEPENDENT_AMBULATORY_CARE_PROVIDER_SITE_OTHER): Payer: BLUE CROSS/BLUE SHIELD | Admitting: Podiatry

## 2016-03-09 DIAGNOSIS — M722 Plantar fascial fibromatosis: Secondary | ICD-10-CM | POA: Diagnosis not present

## 2016-03-16 DIAGNOSIS — M722 Plantar fascial fibromatosis: Secondary | ICD-10-CM | POA: Insufficient documentation

## 2016-03-16 NOTE — Progress Notes (Signed)
Subjective: 66 year old male persists the office. Evaluation of left heel pain, plantar fasciitis after picking up inserts. He states that he is doing substantially better however he still gets some pain in the morning he first gets up. He states the orthotics are fitting well. Denies any swelling or redness. No numbness or tingling. He has been stretching, icing intermittently.Denies any systemic complaints such as fevers, chills, nausea, vomiting. No acute changes since last appointment, and no other complaints at this time.   Objective: AAO x3, NAD DP/PT pulses palpable bilaterally, CRT less than 3 seconds There is no significant tenderness to palpation along the plantar medial tubercle of the calcaneus at the insertion of plantar fascia on the left foot. There is no pain along the course of the plantar fascia within the arch of the foot. Plantar fascia appears to be intact. There is no pain with lateral compression of the calcaneus or pain with vibratory sensation. There is no pain along the course or insertion of the achilles tendon. No other areas of tenderness to bilateral lower extremities. Equinus is present.  No open lesions or pre-ulcerative lesions.  No pain with calf compression, swelling, warmth, erythema  Assessment: Resolving left heel pain, plantar fasciitis, and without pain but still gets pain when he first gets up  Plan: -All treatment options discussed with the patient including all alternatives, risks, complications.  -Orthotics for fitting well. Continue with this. Continue stretching, icing daily as well as supportive shoe gear. I dispensed a night splint today to help with the equinus and hopefully help prevent some of the morning pain he is having. If symptoms are not resolved in the next 4 weeks to call the office for follow otherwise follow-up as needed. -Patient encouraged to call the office with any questions, concerns, change in symptoms.   Ovid CurdMatthew Kaylon Laroche, DPM

## 2016-04-06 ENCOUNTER — Encounter: Payer: Self-pay | Admitting: Podiatry

## 2016-04-06 ENCOUNTER — Ambulatory Visit (INDEPENDENT_AMBULATORY_CARE_PROVIDER_SITE_OTHER): Payer: BLUE CROSS/BLUE SHIELD | Admitting: Podiatry

## 2016-04-06 DIAGNOSIS — M722 Plantar fascial fibromatosis: Secondary | ICD-10-CM | POA: Diagnosis not present

## 2016-04-06 MED ORDER — TRIAMCINOLONE ACETONIDE 10 MG/ML IJ SUSP: 10.0000 mg | Freq: Once | INTRAMUSCULAR | Status: DC

## 2016-04-06 MED ORDER — TRIAMCINOLONE ACETONIDE 10 MG/ML IJ SUSP
10.0000 mg | Freq: Once | INTRAMUSCULAR | Status: AC
  Administered 2016-04-06: 10 mg

## 2016-04-06 MED ORDER — METHYLPREDNISOLONE 4 MG PO TBPK: ORAL_TABLET | ORAL | 0 refills | Status: DC

## 2016-04-06 NOTE — Progress Notes (Signed)
Subjective: 66 year old male persists the office today for follow-up evaluation of left heel pain, plantar fasciitis. He states that he is doing better but he is still having some pain to the heel. He was been stretching, icing, wearing the plantar fascial brace, night splint and orthotics. He is asking about what type of sneaker he should be wearing mostly with walking. Denies any systemic complaints such as fevers, chills, nausea, vomiting. No acute changes since last appointment, and no other complaints at this time.   Objective: AAO x3, NAD DP/PT pulses palpable bilaterally, CRT less than 3 seconds There is no mild tenderness to palpation along the plantar tubercle of the calcaneus at the insertion of plantar fascia on the left foot, mostly lateral. There is no pain along the course of the plantar fascia within the arch of the foot. Plantar fascia appears to be intact. There is no pain with lateral compression of the calcaneus or pain with vibratory sensation. There is no pain along the course or insertion of the achilles tendon. No other areas of tenderness to bilateral lower extremities. Equinus is present.  No open lesions or pre-ulcerative lesions.  No pain with calf compression, swelling, warmth, erythema  Assessment: Left heel pain; plantar fasciitis   Plan: -All treatment options discussed with the patient including all alternatives, risks, complications.  -Patient elects to proceed with steroid injection into the left heel. Under sterile skin preparation, a total of 2.5cc of kenalog 10, 0.5% Marcaine plain, and 2% lidocaine plain were infiltrated into the symptomatic area without complication. A band-aid was applied. Patient tolerated the injection well without complication. Post-injection care with discussed with the patient. Discussed with the patient to ice the area over the next couple of days to help prevent a steroid flare.  -Prescribed medrol dose pack. Discussed side effects of  the medication and directed to stop if any are to occur and call the office.  -Continue shoe changes and I discussed with him a [employer]on [employer]oks or [employer] shoe. Continue orthotics as well which he is not wearing today. Continue stretching, icing, night splint, brace as well.  -RTC 3-4 weeks   Ovid CurdMatthew Wagoner, DPM

## 2016-05-11 ENCOUNTER — Ambulatory Visit (INDEPENDENT_AMBULATORY_CARE_PROVIDER_SITE_OTHER): Payer: BLUE CROSS/BLUE SHIELD | Admitting: Internal Medicine

## 2016-05-11 ENCOUNTER — Encounter: Payer: Self-pay | Admitting: Internal Medicine

## 2016-05-11 VITALS — BP 126/78 | HR 91 | Temp 97.5°F | Resp 14 | Ht 73.0 in | Wt 218.2 lb

## 2016-05-11 DIAGNOSIS — R1013 Epigastric pain: Secondary | ICD-10-CM | POA: Diagnosis not present

## 2016-05-11 MED ORDER — PANTOPRAZOLE SODIUM 40 MG PO TBEC: 40.0000 mg | DELAYED_RELEASE_TABLET | Freq: Every day | ORAL | 3 refills | Status: DC

## 2016-05-11 NOTE — Progress Notes (Signed)
Pre visit review using our clinic review tool, if applicable. No additional management support is needed unless otherwise documented below in the visit note. 

## 2016-05-11 NOTE — Patient Instructions (Signed)
Take omeprazole or pantoprazole 1 tablet before breakfast every day x 6 weeks  Call if no better or severe symptoms

## 2016-05-11 NOTE — Assessment & Plan Note (Signed)
Epigastric pain: 3 day history of mild epigastric pain, history of cholecystectomy, ROS (-) for any serious sxs. gastritis? Recommend consistent use of omeprazole or pantoprazole for 6 weeks on a empty stomach, call if not gradually better or if develops sx such as severe pain, blood in the stools, nausea vomiting

## 2016-05-11 NOTE — Progress Notes (Signed)
Subjective:    Patient ID: Sean Frye, male    DOB: [birth year], 66 y.o.   MRN: [id number]  DOS:  05/11/2016 Type of visit - description : Acute visit  Interval history: 3 days ago developed epigastric discomfort, mild, no burning-like. Discomfort is steady, does not change with food intake. No radiation. Self started omeprazole a couple of days ago and has not noticed any major difference. Denies taking NSAIDs regularly but  had a round of prednisone prescribed 04/06/2016. Also, self prescribed amoxicillin for a couple days last week.  Review of Systems Denies chest pain, difficulty breathing or palpitations No nausea, vomiting, diarrhea. No blood in the stools. Very seldom has heartburn per se. Bowel movements normal.  Past Medical History:  Diagnosis Date  . Atrial fibrillation (HCC) 2015  . ED (erectile dysfunction) 06/10/2014  . Prediabetes     Past Surgical History:  Procedure Laterality Date  . CARDIOVERSION N/A 05/14/2014   Procedure: CARDIOVERSION;  Surgeon: Chrystie Nose, MD;  Location: [facility] ENDOSCOPY;  Service: Cardiovascular;  Laterality: N/A;  . CHOLECYSTECTOMY  2005  . lipoma removal    . TEE WITHOUT CARDIOVERSION N/A 05/14/2014   Procedure: TRANSESOPHAGEAL ECHOCARDIOGRAM (TEE);  Surgeon: Chrystie Nose, MD;  Location: [facility] ENDOSCOPY;  Service: Cardiovascular;  Laterality: N/A;    Social History   Social History  . Marital status: Single    Spouse name: N/A  . Number of children: 5   . Years of education: N/A   Occupational History  . Barrister's clerk, [employer]    Social History Main Topics  . Smoking status: Never Smoker  . Smokeless tobacco: Never Used  . Alcohol use No  . Drug use: No  . Sexual activity: Not on file   Other Topics Concern  . Not on file   Social History Narrative   Original from [place]   Lives w/ Mrs Darcel Bayley   Former professional soccer player      Allergies as of 05/11/2016   No Known Allergies     Medication List       Accurate as of 05/11/16  2:10 PM. Always use your most recent med list.          aspirin EC 81 MG tablet Take 1 tablet (81 mg total) by mouth daily.   atorvastatin 20 MG tablet Commonly known as:  LIPITOR Take 1 tablet (20 mg total) by mouth daily.   metoprolol succinate 25 MG 24 hr tablet Commonly known as:  TOPROL XL Take 0.5 tablets (12.5 mg total) by mouth daily.   pantoprazole 40 MG tablet Commonly known as:  PROTONIX Take 1 tablet (40 mg total) by mouth daily.          Objective:   Physical Exam BP 126/78 (BP Location: Left Arm, Patient Position: Sitting, Cuff Size: Normal)   Pulse 91   Temp 97.5 F (36.4 C) (Oral)   Resp 14   Ht 6\' 1"  (1.854 m)   Wt 218 lb 4 oz (99 kg)   SpO2 97%   BMI 28.79 kg/m  General:   Well developed, well nourished . NAD.  HEENT:  Normocephalic . Face symmetric, atraumatic. Not pale Lungs:  CTA B Normal respiratory effort, no intercostal retractions, no accessory muscle use. Heart: Irregularly irregular,  no murmur.  no pretibial edema bilaterally  Abdomen:  Not distended, soft, minimal tenderness at the epigastric area. No rebound or rigidity.  Skin: Not pale. Not jaundice Neurologic:  alert & oriented X3.  Speech normal, gait appropriate for age and unassisted Psych--  Cognition and judgment appear intact.  Cooperative with normal attention span and concentration.  Behavior appropriate. No anxious or depressed appearing.    Assessment & Plan:    Assessment > Diabetes --dx 03-2014 A1C 6.7 Atrial fibrillation: DX late 2015 cardioversion 04-2014, s/p xarelto ,Rx ASA ; went back  on Afib again 2017, saw cards 08-2015: rx to stay on ASA and rate control Erectile dysfunction: Related to beta blockers? Chronic rhinitis   PLAN: Epigastric pain: 3 day history of mild epigastric pain, history of cholecystectomy, ROS (-) for any serious sxs. gastritis? Recommend consistent use of omeprazole or pantoprazole for 6 weeks on a  empty stomach, call if not gradually better or if develops sx such as severe pain, blood in the stools, nausea vomiting

## 2016-11-25 ENCOUNTER — Emergency Department (HOSPITAL_COMMUNITY): Admission: EM | Admit: 2016-11-25 | Discharge: 2016-11-25 | Discharge status: Against Medical Advice | Payer: Self-pay

## 2016-11-27 ENCOUNTER — Encounter: Payer: Self-pay | Admitting: Medical

## 2016-11-27 ENCOUNTER — Ambulatory Visit (INDEPENDENT_AMBULATORY_CARE_PROVIDER_SITE_OTHER): Payer: Self-pay | Admitting: Medical

## 2016-11-27 ENCOUNTER — Ambulatory Visit: Payer: BLUE CROSS/BLUE SHIELD | Admitting: Medical

## 2016-11-27 VITALS — BP 107/73 | HR 90 | Temp 98.3°F | Ht 73.0 in | Wt 225.2 lb

## 2016-11-27 DIAGNOSIS — H6690 Otitis media, unspecified, unspecified ear: Secondary | ICD-10-CM

## 2016-11-27 DIAGNOSIS — J3489 Other specified disorders of nose and nasal sinuses: Secondary | ICD-10-CM

## 2016-11-27 DIAGNOSIS — R059 Cough, unspecified: Secondary | ICD-10-CM

## 2016-11-27 DIAGNOSIS — R05 Cough: Secondary | ICD-10-CM

## 2016-11-27 DIAGNOSIS — J40 Bronchitis, not specified as acute or chronic: Secondary | ICD-10-CM

## 2016-11-27 MED ORDER — FLUTICASONE PROPIONATE 50 MCG/ACT NA SUSP: 2.0000 | Freq: Every day | NASAL | 1 refills | Status: DC

## 2016-11-27 MED ORDER — AMOXICILLIN-POT CLAVULANATE 875-125 MG PO TABS: 1.0000 | ORAL_TABLET | Freq: Two times a day (BID) | ORAL | 0 refills | Status: DC

## 2016-11-27 MED ORDER — BENZONATATE 100 MG PO CAPS: 100.0000 mg | ORAL_CAPSULE | Freq: Three times a day (TID) | ORAL | 0 refills | Status: DC | PRN

## 2016-11-27 NOTE — Patient Instructions (Addendum)
You do appear on exam to have pharyngitis with right otitis media. Also some symptoms of sinus pressure/congestion and bronchitis.  For ear infection and pharyngitis, I am going to prescribe Augmentin antibiotic. This is  stronger than plain amoxicillin and I'm going to give full 10 day course of antibiotic.  For cough prescription of benzonatate and for nasal congestion Flonase nasal spray.  If chest congestion  persist despite the above then would recommend chest x-ray.  If your symptoms worsen or change please notify us  Follow up in 7-10 days or as needed

## 2016-11-27 NOTE — Progress Notes (Signed)
Subjective:    Patient ID: Sean Frye, male    DOB: 01-05-1951, 66 y.o.   MRN: [id number]  HPI  Pt in with 5 days of cough with some fever. When he coughs he will bring up some muous. Also some nasal congestion and ear pressure as well.   In the morning will bring up mucous when he coughs.(some faint blood tinge to mucous)  Pt has some mild body aches early on but now for 2 days seems resolved.  Pt has tried amoxicillin 500 mg 3 tabs today.    Review of Systems  Constitutional: Negative for chills, diaphoresis and fatigue.  HENT: Positive for congestion, ear pain and postnasal drip. Negative for sinus pain and sinus pressure.        Faint st.  Ear pressure.  Respiratory: Positive for cough. Negative for chest tightness and shortness of breath.        Some productive cough.  Cardiovascular: Negative for chest pain and palpitations.  Gastrointestinal: Negative for abdominal pain.  Genitourinary: Negative for decreased urine volume, discharge, frequency, genital sores, hematuria, penile pain, penile swelling and testicular pain.  Musculoskeletal: Negative for back pain, joint swelling, myalgias and neck stiffness.  Skin: Negative for rash.  Neurological: Negative for dizziness, seizures, speech difficulty, weakness, numbness and headaches.  Hematological: Negative for adenopathy. Does not bruise/bleed easily.  Psychiatric/Behavioral: Negative for behavioral problems, confusion and sleep disturbance. The patient is not nervous/anxious.     Past Medical History:  Diagnosis Date  . Atrial fibrillation (HCC) 2015  . ED (erectile dysfunction) 06/10/2014  . Prediabetes      Social History   Social History  . Marital status: Single    Spouse name: N/A  . Number of children: 5   . Years of education: N/A   Occupational History  . Barrister's clerk, [employer]    Social History Main Topics  . Smoking status: Never Smoker  . Smokeless tobacco: Never Used  . Alcohol use  No  . Drug use: No  . Sexual activity: Not on file   Other Topics Concern  . Not on file   Social History Narrative   Original from [place]   Lives w/ Mrs Darcel Bayley   Former professional soccer player    Past Surgical History:  Procedure Laterality Date  . CARDIOVERSION N/A 05/14/2014   Procedure: CARDIOVERSION;  Surgeon: Chrystie Nose, MD;  Location: [facility] ENDOSCOPY;  Service: Cardiovascular;  Laterality: N/A;  . CHOLECYSTECTOMY  2005  . lipoma removal    . TEE WITHOUT CARDIOVERSION N/A 05/14/2014   Procedure: TRANSESOPHAGEAL ECHOCARDIOGRAM (TEE);  Surgeon: Chrystie Nose, MD;  Location: [facility] ENDOSCOPY;  Service: Cardiovascular;  Laterality: N/A;    Family History  Problem Relation Age of Onset  . Diabetes Sister   . Diabetes Mother        cirrhosis  . Prostate cancer Father        ? of cancer   . CAD Neg Hx   . Colon cancer Neg Hx     No Known Allergies  Current Outpatient Prescriptions on File Prior to Visit  Medication Sig Dispense Refill  . aspirin EC 81 MG tablet Take 1 tablet (81 mg total) by mouth daily. 90 tablet 3  . atorvastatin (LIPITOR) 20 MG tablet Take 1 tablet (20 mg total) by mouth daily. 30 tablet 6  . metoprolol succinate (TOPROL XL) 25 MG 24 hr tablet Take 0.5 tablets (12.5 mg total) by mouth daily. 45 tablet 1  .  pantoprazole (PROTONIX) 40 MG tablet Take 1 tablet (40 mg total) by mouth daily. 30 tablet 3   No current facility-administered medications on file prior to visit.     BP 107/73 (BP Location: Right Arm, Patient Position: Sitting, Cuff Size: Normal)   Pulse 90   Temp 98.3 F (36.8 C) (Oral)   Ht  (1.854 m)   Wt 225 lb 3.2 oz (102.2 kg)   SpO2 100%   BMI 29.71 kg/m       Objective:   Physical Exam  General  Mental Status - Alert. General Appearance - Well groomed. Not in acute distress.  Skin Rashes- No Rashes.  HEENT Head- Normal. Ear Auditory Canal - Left- Normal. Right - Normal.Tympanic Membrane- Left- Normal. Right-  Normal. Eye Sclera/Conjunctiva- Left- Normal. Right- Normal. Nose & Sinuses Nasal Mucosa- Left-  Boggy and Congested. Right-  Boggy and  Congested.Bilateral maxillary and frontal sinus pressure. Mouth & Throat Lips: Upper Lip- Normal: no dryness, cracking, pallor, cyanosis, or vesicular eruption. Lower Lip-Normal: no dryness, cracking, pallor, cyanosis or vesicular eruption. Buccal Mucosa- Bilateral- No Aphthous ulcers. Oropharynx- No Discharge or Erythema. Tonsils: Characteristics- Bilateral- No Erythema or Congestion. Size/Enlargement- Bilateral- No enlargement. Discharge- bilateral-None.  Neck Neck- Supple. No Masses.   Chest and Lung Exam Auscultation: Breath Sounds:-Clear even and unlabored.  Cardiovascular Auscultation:Rythm- Regular, rate and rhythm. Murmurs & Other Heart Sounds:Ausculatation of the heart reveal- No Murmurs.  Lymphatic Head & Neck General Head & Neck Lymphatics: Bilateral: Description- No Localized lymphadenopathy.        Assessment & Plan:  You do appear on exam to have pharyngitis with right otitis media. Also some symptoms of sinus pressure and bronchitis.  For ear infection and pharyngitis, I am going to prescribe Augmentin antibiotic. This is  stronger than plain amoxicillin and I'm going to give full 10 day course of antibiotic.  For cough prescription of benzonatate and for nasal congestion Flonase nasal spray.  If chest congestion persists despite the above then would recommend chest x-ray  If your symptoms worsen or change please notify us  Follow up in 7-10 days or as needed  Amaziah Raisanen, Ramon Dredge, [employer]

## 2017-03-30 HISTORY — PX: APPENDECTOMY: SHX54

## 2017-04-09 ENCOUNTER — Telehealth: Payer: Self-pay | Admitting: Emergency Medicine

## 2017-04-09 ENCOUNTER — Encounter: Payer: Self-pay | Admitting: Internal Medicine

## 2017-04-09 ENCOUNTER — Ambulatory Visit (INDEPENDENT_AMBULATORY_CARE_PROVIDER_SITE_OTHER): Payer: Self-pay | Admitting: Internal Medicine

## 2017-04-09 VITALS — BP 118/78 | HR 58 | Temp 98.7°F | Resp 16 | Ht 73.0 in | Wt 200.0 lb

## 2017-04-09 DIAGNOSIS — Z9049 Acquired absence of other specified parts of digestive tract: Secondary | ICD-10-CM

## 2017-04-09 DIAGNOSIS — I4891 Unspecified atrial fibrillation: Secondary | ICD-10-CM

## 2017-04-09 NOTE — Progress Notes (Signed)
Subjective:    Patient ID: Sean BandaHector E Sampsel, male    DOB: [birth year], 67 y.o.   MRN: [id number]  DOS:  04/09/2017 Type of visit - description : Acute visit Interval history: Was visiting [place], developed  appendicitis and had surgery 04/02/2017.  Here for staple removal. The patient reports that the laparoscopic surgery was converted to an open one.  They left a drain but that was removed, and he was discharged home without p.o. antibiotics.  Currently doing well.   Review of Systems Denies fever chills No nausea vomiting Appetite is okay Bowel movements normal.  Past Medical History:  Diagnosis Date  . Atrial fibrillation (HCC) 2015  . ED (erectile dysfunction) 06/10/2014  . Prediabetes     Past Surgical History:  Procedure Laterality Date  . CARDIOVERSION N/A 05/14/2014   Procedure: CARDIOVERSION;  Surgeon: Chrystie NoseKenneth C Hilty, MD;  [facility];  Service: Cardiovascular;  Laterality: N/A;  . CHOLECYSTECTOMY  2005  . lipoma removal    . TEE WITHOUT CARDIOVERSION N/A 05/14/2014   Procedure: TRANSESOPHAGEAL ECHOCARDIOGRAM (TEE);  Surgeon: Chrystie NoseKenneth C Hilty, MD;  [facility];  Service: Cardiovascular;  Laterality: N/A;    Social History   Socioeconomic History  . Marital status: Single    Spouse name: Not on file  . Number of children: 5   . Years of education: Not on file  . Highest education level: Not on file  Social Needs  . Financial resource strain: Not on file  . Food insecurity - worry: Not on file  . Food insecurity - inability: Not on file  . Transportation needs - medical: Not on file  . Transportation needs - non-medical: Not on file  Occupational History  . Occupation: Barrister's clerkairplane mechanic, [employer]  Tobacco Use  . Smoking status: Never Smoker  . Smokeless tobacco: Never Used  Substance and Sexual Activity  . Alcohol use: No    Alcohol/week: 0.0 oz  . Drug use: No  . Sexual activity: Not on file  Other Topics Concern  . Not on file    Social History Narrative   Original from [place]   Lives w/ Mrs Darcel Bayleyrma   Former professional soccer player      Allergies as of 04/09/2017   No Known Allergies     Medication List        Accurate as of 04/09/17 11:59 PM. Always use your most recent med list.          amoxicillin-clavulanate 875-125 MG tablet Commonly known as:  AUGMENTIN Take 1 tablet by mouth 2 (two) times daily.   aspirin EC 81 MG tablet Take 1 tablet (81 mg total) by mouth daily.   atorvastatin 20 MG tablet Commonly known as:  LIPITOR Take 1 tablet (20 mg total) by mouth daily.   benzonatate 100 MG capsule Commonly known as:  TESSALON Take 1 capsule (100 mg total) by mouth 3 (three) times daily as needed for cough.   fluticasone 50 MCG/ACT nasal spray Commonly known as:  FLONASE Place 2 sprays into both nostrils daily.   metoprolol succinate 25 MG 24 hr tablet Commonly known as:  TOPROL XL Take 0.5 tablets (12.5 mg total) by mouth daily.   pantoprazole 40 MG tablet Commonly known as:  PROTONIX Take 1 tablet (40 mg total) by mouth daily.          Objective:   Physical Exam  Abdominal:     BP 118/78 (BP Location: Right Arm, Patient Position: Sitting, Cuff Size:  Large)   Pulse (!) 58   Temp 98.7 F (37.1 C) (Oral)   Resp 16   Ht 6\' 1"  (1.854 m)   Wt 200 lb (90.7 kg)   SpO2 98%   BMI 26.39 kg/m  General:   Well developed, well nourished . NAD.  HEENT:  Normocephalic . Face symmetric, atraumatic Lungs:  CTA B Normal respiratory effort, no intercostal retractions, no accessory muscle use. Heart: Irregularly irregular.  No pretibial edema bilaterally  Skin: Not pale. Not jaundice Neurologic:  alert & oriented X3.  Speech normal, gait appropriate for age and unassisted Psych--  Cognition and judgment appear intact.  Cooperative with normal attention span and concentration.  Behavior appropriate. No anxious or depressed appearing.      Assessment & Plan:      Assessment > Diabetes --dx 03-2014 A1C 6.7 Atrial fibrillation: DX late 2015 cardioversion 04-2014, s/p xarelto ,Rx ASA ; went back  on Afib again 2017, saw cards 08-2015: rx to stay on ASA and rate control Erectile dysfunction: Related to beta blockers? Chronic rhinitis   PLAN: Appendectomy: Recovering from appendectomy, I spoke w/ the local surgery group triage nurse, the rec remove the staples  10-14 days after surgery, patient will RTC 3 days for staple removal Atrial fibrillation: On aspirin, cardiology notes reviewed, at age 33 he is supposed to transition to Xarelto.  Refer to cardiology, for now continue with aspirin RTC 3 days

## 2017-04-09 NOTE — Telephone Encounter (Signed)
Copied from CRM [id number]. Topic: Quick Communication - See Telephone Encounter >> Apr 09, 2017  1:05 PM Waymon AmatoBurton, Donna F wrote: CRM for notification. See Telephone encounter for: pt is calling to speak with Annice PihJackie -language barrier not sure of what patient needed   Best number [phone]  04/09/17.

## 2017-04-09 NOTE — Progress Notes (Signed)
Pre visit review using our clinic review tool, if applicable. No additional management support is needed unless otherwise documented below in the visit note. 

## 2017-04-09 NOTE — Patient Instructions (Signed)
Please come back in 3 days  We are referring you back to the cardiologist

## 2017-04-10 ENCOUNTER — Encounter: Payer: Self-pay | Admitting: Internal Medicine

## 2017-04-10 NOTE — Telephone Encounter (Signed)
Called pt and pt stated that had gotten confused with his date of surgery (it was done on January 30 or 31, 2019) Pt just wanted to verify if still ok to come on Thursday to get clips removed. Verified with provider and provider indicated ok to leave appt on Thursday 14,2019. Pt understood and will come to his appt.

## 2017-04-10 NOTE — Assessment & Plan Note (Signed)
Appendectomy: Recovering from appendectomy, I spoke w/ the local surgery group triage nurse, the rec remove the staples  10-14 days after surgery, patient will RTC 3 days for staple removal Atrial fibrillation: On aspirin, cardiology notes reviewed, at age 67 he is supposed to transition to Xarelto.  Refer to cardiology, for now continue with aspirin RTC 3 days

## 2017-04-11 ENCOUNTER — Telehealth: Payer: Self-pay

## 2017-04-11 NOTE — Telephone Encounter (Addendum)
Received [facility]- 24 pages. Placed in MD red folder for review.

## 2017-04-11 NOTE — Telephone Encounter (Signed)
Was admitted to the hospital via ER with abdominal pain, blood per rectum, diarrhea.  Hhe also had RLQ abdominal pain. Was noted to be in A. fib with RVR  CT showed iflammation and fluid within the small bowel, the appendix was not easily identified, he was very tender at the right lower quadrant on exam. He eventually underwent surgery for a perforated appendix 03/28/2017.  Exploratory laparotomy with ileocecectomy. He had fecal peritonitis. He was seen by cardiology during the hospital stay. Multiple labs reviewed, creatinine was initially elevated to 2.0 but subsequently was 0.9, potassium was satisfactory at the time of discharge, LFTs were normal, TSH normal. CBC showed elevated WBCs initially, then they normalized to 10.4.  No anemia.  Platelets normal. Blood culture: Gram-negative rods noted. Pathology report showing appendicitis. Patient to be seen  tomorrow for staples removal.

## 2017-04-11 NOTE — Telephone Encounter (Signed)
Received Part 2 of medical records- 20 pages. Placed in MD red folder.

## 2017-04-12 ENCOUNTER — Encounter: Payer: Self-pay | Admitting: Internal Medicine

## 2017-04-12 ENCOUNTER — Ambulatory Visit (INDEPENDENT_AMBULATORY_CARE_PROVIDER_SITE_OTHER): Payer: Self-pay | Admitting: Internal Medicine

## 2017-04-12 VITALS — BP 114/78 | HR 62 | Temp 97.7°F | Resp 16 | Ht 73.0 in

## 2017-04-12 DIAGNOSIS — R739 Hyperglycemia, unspecified: Secondary | ICD-10-CM

## 2017-04-12 DIAGNOSIS — Z9049 Acquired absence of other specified parts of digestive tract: Secondary | ICD-10-CM

## 2017-04-12 DIAGNOSIS — I4891 Unspecified atrial fibrillation: Secondary | ICD-10-CM

## 2017-04-12 LAB — CBC WITH DIFFERENTIAL/PLATELET
Basophils Absolute: 0.1 10*3/uL (ref 0.0–0.1)
Basophils Relative: 1 % (ref 0.0–3.0)
Eosinophils Absolute: 0.1 10*3/uL (ref 0.0–0.7)
Eosinophils Relative: 1.6 % (ref 0.0–5.0)
HEMATOCRIT: 46.9 % (ref 39.0–52.0)
HEMOGLOBIN: 15.6 g/dL (ref 13.0–17.0)
LYMPHS PCT: 23.3 % (ref 12.0–46.0)
Lymphs Abs: 1.8 10*3/uL (ref 0.7–4.0)
MCHC: 33.1 g/dL (ref 30.0–36.0)
MCV: 86.6 fl (ref 78.0–100.0)
MONO ABS: 0.5 10*3/uL (ref 0.1–1.0)
Monocytes Relative: 6.5 % (ref 3.0–12.0)
Neutro Abs: 5.3 10*3/uL (ref 1.4–7.7)
Neutrophils Relative %: 67.6 % (ref 43.0–77.0)
Platelets: 460 10*3/uL — ABNORMAL HIGH (ref 150.0–400.0)
RBC: 5.42 Mil/uL (ref 4.22–5.81)
RDW: 14.1 % (ref 11.5–15.5)
WBC: 7.9 10*3/uL (ref 4.0–10.5)

## 2017-04-12 LAB — COMPREHENSIVE METABOLIC PANEL
ALBUMIN: 3.7 g/dL (ref 3.5–5.2)
ALT: 32 U/L (ref 0–53)
AST: 24 U/L (ref 0–37)
Alkaline Phosphatase: 90 U/L (ref 39–117)
BUN: 12 mg/dL (ref 6–23)
CALCIUM: 8.9 mg/dL (ref 8.4–10.5)
CO2: 26 meq/L (ref 19–32)
Chloride: 103 mEq/L (ref 96–112)
Creatinine, Ser: 0.92 mg/dL (ref 0.40–1.50)
GFR: 87.64 mL/min (ref >60.00)
Glucose, Bld: 118 mg/dL — ABNORMAL HIGH (ref 70–99)
POTASSIUM: 3.9 meq/L (ref 3.5–5.1)
Sodium: 136 mEq/L (ref 135–145)
Total Bilirubin: 0.6 mg/dL (ref 0.2–1.2)
Total Protein: 7.7 g/dL (ref 6.0–8.3)

## 2017-04-12 LAB — HEMOGLOBIN A1C: HEMOGLOBIN A1C: 6.6 % — AB (ref 4.6–6.5)

## 2017-04-12 NOTE — Patient Instructions (Signed)
GO TO THE LAB : Get the blood work     GO TO THE FRONT DESK Schedule your next appointment for a  physical exam in 4 months , fasting 

## 2017-04-12 NOTE — Progress Notes (Signed)
Subjective:    Patient ID: Sean Frye, male    DOB: 1951-01-14, 67 y.o.   MRN: [id number]  DOS:  04/12/2017 Type of visit - description : Follow-up Interval history: Since the last visit, we received records from his recent admission, they are summarized below:  Was admitted to the hospital via ER with abdominal pain, blood per rectum, diarrhea.  Hhe also had RLQ abdominal pain. Was noted to be in A. fib with RVR  CT showed iflammation and fluid within the small bowel, the appendix was not easily identified, he was very tender at the right lower quadrant on exam. He eventually underwent surgery for a perforated appendix 03/28/2017.  Exploratory laparotomy with ileocecectomy. He had fecal peritonitis. He was seen by cardiology during the hospital stay. Multiple labs reviewed, creatinine was initially elevated to 2.0 but subsequently was 0.9, potassium was satisfactory at the time of discharge, LFTs were normal, TSH normal. CBC showed elevated WBCs initially, then they normalized to 10.4.  No anemia.  Platelets normal. Blood culture: Gram-negative rods noted. Pathology report showing appendicitis.   Wt Readings from Last 3 Encounters:  04/09/17 200 lb (90.7 kg)  11/27/16 225 lb 3.2 oz (102.2 kg)  05/11/16 218 lb 4 oz (99 kg)      Review of Systems He continued to do well. Denies fever, chills. No nausea, vomiting, diarrhea. Good p.o. tolerance.   Past Medical History:  Diagnosis Date  . Atrial fibrillation (HCC) 2015  . ED (erectile dysfunction) 06/10/2014  . Prediabetes     Past Surgical History:  Procedure Laterality Date  . CARDIOVERSION N/A 05/14/2014   Procedure: CARDIOVERSION;  Surgeon: Chrystie NoseKenneth C Hilty, MD;  [facility];  Service: Cardiovascular;  Laterality: N/A;  . CHOLECYSTECTOMY  2005  . lipoma removal    . open appendectomy  03/2017  . TEE WITHOUT CARDIOVERSION N/A 05/14/2014   Procedure: TRANSESOPHAGEAL ECHOCARDIOGRAM (TEE);  Surgeon:  Chrystie NoseKenneth C Hilty, MD;  [facility];  Service: Cardiovascular;  Laterality: N/A;    Social History   Socioeconomic History  . Marital status: Single    Spouse name: Not on file  . Number of children: 5   . Years of education: Not on file  . Highest education level: Not on file  Social Needs  . Financial resource strain: Not on file  . Food insecurity - worry: Not on file  . Food insecurity - inability: Not on file  . Transportation needs - medical: Not on file  . Transportation needs - non-medical: Not on file  Occupational History  . Occupation: Barrister's clerkairplane mechanic, [employer]  Tobacco Use  . Smoking status: Never Smoker  . Smokeless tobacco: Never Used  Substance and Sexual Activity  . Alcohol use: No    Alcohol/week: 0.0 oz  . Drug use: No  . Sexual activity: Not on file  Other Topics Concern  . Not on file  Social History Narrative   Original from [place]   Lives w/ Mrs Darcel Bayleyrma   Former professional soccer player      Allergies as of 04/12/2017   No Known Allergies     Medication List        Accurate as of 04/12/17  9:26 AM. Always use your most recent med list.          amoxicillin-clavulanate 875-125 MG tablet Commonly known as:  AUGMENTIN Take 1 tablet by mouth 2 (two) times daily.   aspirin EC 81 MG tablet Take 1 tablet (81 mg total)  by mouth daily.   atorvastatin 20 MG tablet Commonly known as:  LIPITOR Take 1 tablet (20 mg total) by mouth daily.   benzonatate 100 MG capsule Commonly known as:  TESSALON Take 1 capsule (100 mg total) by mouth 3 (three) times daily as needed for cough.   fluticasone 50 MCG/ACT nasal spray Commonly known as:  FLONASE Place 2 sprays into both nostrils daily.   metoprolol succinate 25 MG 24 hr tablet Commonly known as:  TOPROL XL Take 0.5 tablets (12.5 mg total) by mouth daily.   pantoprazole 40 MG tablet Commonly known as:  PROTONIX Take 1 tablet (40 mg total) by mouth daily.          Objective:    Physical Exam BP 114/78 (BP Location: Left Arm, Patient Position: Sitting, Cuff Size: Small)   Pulse 62   Temp 97.7 F (36.5 C) (Oral)   Resp 16   Ht 6\' 1"  (1.854 m)   SpO2 99%   BMI 26.39 kg/m  General:   Well developed, well nourished . NAD.  HEENT:  Normocephalic . Face symmetric, atraumatic Lungs:  CTA B Normal respiratory effort, no intercostal retractions, no accessory muscle use. Heart: Irregularly irregular,  no murmur.  no pretibial edema bilaterally  Abdomen:  Not distended, soft, non-tender.  Well-healed surgical scar Skin: Not pale. Not jaundice Neurologic:  alert & oriented X3.  Speech normal, gait appropriate for age and unassisted Psych--  Cognition and judgment appear intact.  Cooperative with normal attention span and concentration.  Behavior appropriate. No anxious or depressed appearing.     Assessment & Plan:    Assessment > Diabetes --dx 03-2014 A1C 6.7 Atrial fibrillation: DX late 2015 cardioversion 04-2014, s/p xarelto ,Rx ASA ; went back  on Afib again 2017, saw cards 08-2015: rx to stay on ASA and rate control Erectile dysfunction: Related to beta blockers? Chronic rhinitis  PLAN: Appendectomy: Recovering well. Staples removed w/o problem.  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  Staples removed without problems.  Wound care instructions provided DM: Diet controlled, he has lost weight, even before he had appendectomy.  Check a A1c Atrial fibrillation: Currently on aspirin, rate control with beta-blockers.  He has been already refer to cardiology, was rec to   start Xarelto at age 76.  Check a CMP, CBC. Travel advice: Going to [place] in a  few days,  DVT prevention discussed including using compression stockings which he has, frequent  mobilization during the airplane trip. RTC 4 months, CPX fasting.

## 2017-04-12 NOTE — Assessment & Plan Note (Signed)
PLAN: Appendectomy: Recovering well. Staples removed w/o problem.  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  Staples removed without problems.  Wound care instructions provided DM: Diet controlled, he has lost weight, even before he had appendectomy.  Check a A1c Atrial fibrillation: Currently on aspirin, rate control with beta-blockers.  He has been already refer to cardiology, was rec to   start Xarelto at age 67.  Check a CMP, CBC. Travel advice: Going to [place] in a  few days,  DVT prevention discussed including using compression stockings which he has, frequent  mobilization during the airplane trip. RTC 4 months, CPX fasting.

## 2017-04-12 NOTE — Progress Notes (Signed)
Pre visit review using our clinic review tool, if applicable. No additional management support is needed unless otherwise documented below in the visit note. 

## 2017-07-30 ENCOUNTER — Ambulatory Visit (INDEPENDENT_AMBULATORY_CARE_PROVIDER_SITE_OTHER): Payer: Self-pay | Admitting: Internal Medicine

## 2017-07-30 ENCOUNTER — Encounter: Payer: Self-pay | Admitting: Internal Medicine

## 2017-07-30 VITALS — BP 120/68 | HR 100 | Ht 71.0 in | Wt 204.8 lb

## 2017-07-30 DIAGNOSIS — I481 Persistent atrial fibrillation: Secondary | ICD-10-CM

## 2017-07-30 DIAGNOSIS — I4819 Other persistent atrial fibrillation: Secondary | ICD-10-CM

## 2017-07-30 DIAGNOSIS — E785 Hyperlipidemia, unspecified: Secondary | ICD-10-CM

## 2017-07-30 MED ORDER — ATORVASTATIN CALCIUM 20 MG PO TABS: 20.0000 mg | ORAL_TABLET | Freq: Every day | ORAL | 3 refills | Status: DC

## 2017-07-30 MED ORDER — METOPROLOL SUCCINATE ER 25 MG PO TB24: 12.5000 mg | ORAL_TABLET | Freq: Every day | ORAL | 3 refills | Status: DC

## 2017-07-30 NOTE — Patient Instructions (Signed)
Your physician wants you to follow-up in: ONE YEAR with Dr. Hilty. You will receive a reminder letter in the mail two months in advance. If you don't receive a letter, please call our office to schedule the follow-up appointment.  

## 2017-07-30 NOTE — Progress Notes (Signed)
OFFICE NOTE  Chief Complaint:  Follow-up A. fib  Physician: Wanda PlumpPaz, Jose E, MD  HPI:  Sean Frye is a pleasant 67 year old male who is coming referred to me for evaluation of atrial fibrillation. Apparently he had an EKG in August of this past year which demonstrated A. fib and was referred to cardiology, but follow-up was never made. He was scheduled to see me in December but had to travel to [place] for family. He recently saw his primary care provider and was again noted to be in A. fib. He was started on aspirin which she's been taking for 2 weeks. He is [age] with no cardiac risk factors therefore has a low CHADSVASC score of 0-1. He is totally unaware of his atrial fibrillation. He denies any worsening shortness of breath, fatigue or exercise intolerance. He also denies any chest pain. He continues to do some exercise and some weight lifting without any limitations. He reports good sleep at night and denies snoring or any signs of sleep apnea. There is no history that he is aware of of A. fib in the family.  I recommended TEE cardioversion. He did undergo a TEE which showed no evidence of left atrial thrombus and normal LV function. There is mild valvular disease but nothing of concern. He underwent successful cardioversion and has been maintaining sinus rhythm. He says that he's feeling better and has more energy. He discontinued his Xarelto when he ran out of his prescription yesterday. He's also run out of metoprolol 3 days ago. He was not under the impression that he needed to maintain these medications. We had a long discussion today with the help of the Spanish translator, and I made it clear that he should stay on metoprolol to try to help keep him from going out of rhythm. However, because of his low risk for stroke, at this point I think he can be on daily aspirin. Xarelto was fairly expensive. There isn't clear data that he needs to be on it with his low CHADSVASC score now one month  post cardioversion. Should he have recurrence of A. fib, however he may need to go back on more significant anticoagulation.   Mr. Sean Frye returns today for follow-up. Overall he is doing well. He's had no recurrent A. fib. EKG today sinus rhythm. He's currently on aspirin. He reports some ongoing problems with erectile dysfunction. He is currently using 120 mg tablet of sildenafil. I reiterated that he could use up to 3 tablets as needed. He remains on low-dose metoprolol for rate control and blood pressure which is tolerating. He was recently started on low-dose atorvastatin for cholesterol.   09/03/2015  Mr. Sean Frye returns today for follow-up. He was referred back from Dr. Phylis BougiePonce after recent office visit in which she was noted to have an irregular rhythm. An EKG demonstrated he was back in atrial fibrillation, however he reports he has no symptoms of A. fib. In fact [place] annual labor without any chest pain, shortness of breath or worsening fatigue. 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 Since he has had recurrence within a fairly short time, the likelihood of recurrent A. fib has increased going forward. We discussed options including repeat cardioversion since he's been on one month of Xarelto which was started by his primary care provider or consider continuing rate control and long-term anticoagulation. Again, I'm not certain that he  has any history of hypertension therefore his CHADSVASC score now is 0, but will go up to 1 when he turned 66. This would put his stroke risk summer between 0 and 1% annually. Based on current guidelines, it is reasonable to treat him with aspirin therapy which should lower his stroke risk to 0.5%.  07/30/2017  Mr. Sean Dance returns for follow-up of his A. fib.  He was seen today with a Spanish translator named [place], [phone] by video translator  service.  Mr. Sean Dance had denies any chest pain or worsening shortness of breath.  He is unaware of his A. fib which she is persistently in by EKG today.  There was some confusion on medications, specifically he was taking atorvastatin every other day as needed.  He understands and was advised to take that medication on a daily basis.  He reports compliance with aspirin and metoprolol as directed.  We discussed the fact that he is now 78 and per guidelines does have an additional cardiac risk factor.  He is CHADSVASC score however is 1 with a stroke risk of about 1.3%.  Aspirin should lower that risk to 1% and he could be further reduced in wrist to about 0.4 to 0.5% on Eliquis or Xarelto, however the bleeding risk does go up from 1% to 3%.  PMHx:  Past Medical History:  Diagnosis Date  . Atrial fibrillation (HCC) 2015  . ED (erectile dysfunction) 06/10/2014  . Prediabetes     Past Surgical History:  Procedure Laterality Date  . APPENDECTOMY  03/2017   lap converted to exploratory laparotomy with ileocecectomy  . CARDIOVERSION N/A 05/14/2014   Procedure: CARDIOVERSION;  Surgeon: Chrystie Nose, MD;  Location: [facility] ENDOSCOPY;  Service: Cardiovascular;  Laterality: N/A;  . CHOLECYSTECTOMY  2005  . lipoma removal    . TEE WITHOUT CARDIOVERSION N/A 05/14/2014   Procedure: TRANSESOPHAGEAL ECHOCARDIOGRAM (TEE);  Surgeon: Chrystie Nose, MD;  Location: [facility] ENDOSCOPY;  Service: Cardiovascular;  Laterality: N/A;    FAMHx:  Family History  Problem Relation Age of Onset  . Diabetes Sister   . Diabetes Mother        cirrhosis  . Prostate cancer Father        ? of cancer   . CAD Neg Hx   . Colon cancer Neg Hx     SOCHx:   reports that he has never smoked. He has never used smokeless tobacco. He reports that he does not drink alcohol or use drugs.  ALLERGIES:  No Known Allergies  ROS: Pertinent items noted in HPI and remainder of comprehensive ROS otherwise negative.  HOME MEDS: Current  Outpatient Medications  Medication Sig Dispense Refill  . aspirin EC 81 MG tablet Take 1 tablet (81 mg total) by mouth daily. 90 tablet 3  . atorvastatin (LIPITOR) 20 MG tablet Take 1 tablet (20 mg total) by mouth daily. 30 tablet 6  . fluticasone (FLONASE) 50 MCG/ACT nasal spray Place 2 sprays into both nostrils daily. 16 g 1  . metoprolol succinate (TOPROL XL) 25 MG 24 hr tablet Take 0.5 tablets (12.5 mg total) by mouth daily. 45 tablet 1  . pantoprazole (PROTONIX) 40 MG tablet Take 1 tablet (40 mg total) by mouth daily. 30 tablet 3   No current facility-administered medications for this visit.     LABS/IMAGING: No results found for this or any previous visit (from the past 48 hour(s)). No results found.  VITALS: BP 120/68   Pulse 100   Ht 5'  11" (1.803 m)   Wt 204 lb 12.8 oz (92.9 kg)   BMI 28.56 kg/m   EXAM: General appearance: alert and no distress Lungs: clear to auscultation bilaterally Heart: irregularly irregular rhythm Extremities: extremities normal, atraumatic, no cyanosis or edema  EKG: A. fib with controlled ventricular response at 100-personally reviewed  ASSESSMENT: 1. Long-standing persistent A-fib 2. CHADSVASC score of 1 (male >11 yrs age) -on aspirin 3. Erectile dysfunction 4. Dyslipidemia   PLAN: 1.   Mr. Sean Dance seems to be in long-standing persistent A. fib.  He is unaware of this and I have not pushed to get him back into sinus rhythm.  His chads vas score is now 1 given his age over 44.  His stroke risk is still very low and can be reduced somewhat with aspirin.  We could reduce it another half a percent further with Eliquis or Xarelto however the bleeding risk would go up to about 3%.  He says he is doing very well on his current medicines and does not want to switch at this time.  I do not think there is convincing evidence to push for a NOAC at this time.  Follow-up with me in 1 year with a Engineer, structural.  Chrystie Nose, MD, [facility], FACP    [place]  [facility]  Medical Director of the Advanced Lipid Disorders &  Cardiovascular Risk Reduction Clinic Diplomate of the American Board of Clinical Lipidology Attending Cardiologist  Direct Dial: [phone]  Fax: [phone]  Website:  [email or web address] 07/30/2017, 9:39 AM

## 2017-08-27 ENCOUNTER — Telehealth: Payer: Self-pay

## 2017-08-27 ENCOUNTER — Encounter: Payer: Self-pay | Admitting: Internal Medicine

## 2017-08-27 ENCOUNTER — Ambulatory Visit (INDEPENDENT_AMBULATORY_CARE_PROVIDER_SITE_OTHER): Payer: Self-pay | Admitting: Internal Medicine

## 2017-08-27 VITALS — BP 122/68 | HR 96 | Temp 98.0°F | Resp 16 | Ht 71.0 in | Wt 209.1 lb

## 2017-08-27 DIAGNOSIS — I4819 Other persistent atrial fibrillation: Secondary | ICD-10-CM

## 2017-08-27 DIAGNOSIS — R197 Diarrhea, unspecified: Secondary | ICD-10-CM

## 2017-08-27 DIAGNOSIS — I481 Persistent atrial fibrillation: Secondary | ICD-10-CM

## 2017-08-27 NOTE — Telephone Encounter (Signed)
Noted, thank you

## 2017-08-27 NOTE — Telephone Encounter (Signed)
I explained everything to Annice PihJackie and she called him with all instructions. He did voice that he understands.

## 2017-08-27 NOTE — Progress Notes (Signed)
Pre visit review using our clinic review tool, if applicable. No additional management support is needed unless otherwise documented below in the visit note. 

## 2017-08-27 NOTE — Patient Instructions (Signed)
Please go to the lab and get the containers to bring your stool sample  Please schedule a physical exam next month ,  fasting

## 2017-08-27 NOTE — Telephone Encounter (Signed)
Copied from CRM [phone]. Topic: General - Other >> Aug 27, 2017 11:35 AM Percival SpanishKennedy, Cheryl W wrote:  Pt not sure what to do with the red liquid he received at the office today and when the call is returned please have someone that speaks spanish   [phone]

## 2017-08-27 NOTE — Progress Notes (Signed)
Subjective:    Patient ID: Sean Frye, male    DOB: 10-16-1950, 67 y.o.   MRN: [id number]  DOS:  08/27/2017 Type of visit - description : acute Interval history: The patient is here because of diarrhea. Had an appendectomy approximately 5 months ago, after that his BMs were "okay" even in the constipated side. Subsequently went to [place] and after he came back he developed diarrhea. Reports the stools are watery, usually they follow up food intake, 3 or 4 episodes a day, not very large in volume.  CV: Note from cardiology reviewed  Review of Systems Denies any fever chills.  No weight loss No nausea or vomiting No abdominal cramps No blood or mucus in the stools No CP or palpitations   Past Medical History:  Diagnosis Date  . Atrial fibrillation (HCC) 2015  . ED (erectile dysfunction) 06/10/2014  . Prediabetes     Past Surgical History:  Procedure Laterality Date  . APPENDECTOMY  03/2017   lap converted to exploratory laparotomy with ileocecectomy  . CARDIOVERSION N/A 05/14/2014   Procedure: CARDIOVERSION;  Surgeon: Chrystie Nose, MD;  Location: [facility] ENDOSCOPY;  Service: Cardiovascular;  Laterality: N/A;  . CHOLECYSTECTOMY  2005  . lipoma removal    . TEE WITHOUT CARDIOVERSION N/A 05/14/2014   Procedure: TRANSESOPHAGEAL ECHOCARDIOGRAM (TEE);  Surgeon: Chrystie Nose, MD;  Location: [facility] ENDOSCOPY;  Service: Cardiovascular;  Laterality: N/A;    Social History   Socioeconomic History  . Marital status: Single    Spouse name: Not on file  . Number of children: 5   . Years of education: Not on file  . Highest education level: Not on file  Occupational History  . Occupation: Barrister's clerk, [employer]  Social Needs  . Financial resource strain: Not on file  . Food insecurity:    Worry: Not on file    Inability: Not on file  . Transportation needs:    Medical: Not on file    Non-medical: Not on file  Tobacco Use  . Smoking status: Never Smoker  . Smokeless  tobacco: Never Used  Substance and Sexual Activity  . Alcohol use: No    Alcohol/week: 0.0 oz  . Drug use: No  . Sexual activity: Not on file  Lifestyle  . Physical activity:    Days per week: Not on file    Minutes per session: Not on file  . Stress: Not on file  Relationships  . Social connections:    Talks on phone: Not on file    Gets together: Not on file    Attends religious service: Not on file    Active member of club or organization: Not on file    Attends meetings of clubs or organizations: Not on file    Relationship status: Not on file  . Intimate partner violence:    Fear of current or ex partner: Not on file    Emotionally abused: Not on file    Physically abused: Not on file    Forced sexual activity: Not on file  Other Topics Concern  . Not on file  Social History Narrative   Original from [place]   Lives w/ Mrs Darcel Bayley   Former professional soccer player      Allergies as of 08/27/2017   No Known Allergies     Medication List        Accurate as of 08/27/17 11:59 PM. Always use your most recent med list.  aspirin EC 81 MG tablet Take 1 tablet (81 mg total) by mouth daily.   atorvastatin 20 MG tablet Commonly known as:  LIPITOR Take 1 tablet (20 mg total) by mouth daily.   fluticasone 50 MCG/ACT nasal spray Commonly known as:  FLONASE Place 2 sprays into both nostrils daily.   metoprolol succinate 25 MG 24 hr tablet Commonly known as:  TOPROL XL Take 0.5 tablets (12.5 mg total) by mouth daily.          Objective:   Physical Exam BP 122/68 (BP Location: Left Arm, Patient Position: Sitting, Cuff Size: Small)   Pulse 96   Temp 98 F (36.7 C) (Oral)   Resp 16   Ht 5\' 11"  (1.803 m)   Wt 209 lb 2 oz (94.9 kg)   SpO2 96%   BMI 29.17 kg/m  General:   Well developed, NAD, see BMI.  HEENT:  Normocephalic . Face symmetric, atraumatic.  Not pale or jaundice Lungs:  CTA B Normal respiratory effort, no intercostal retractions, no  accessory muscle use. Heart: RRR,  no murmur.  no pretibial edema bilaterally  Abdomen:  Not distended, soft, non-tender. No rebound or rigidity.   Skin: Not pale. Not jaundice Neurologic:  alert & oriented X3.  Speech normal, gait appropriate for age and unassisted Psych--  Cognition and judgment appear intact.  Cooperative with normal attention span and concentration.  Behavior appropriate. No anxious or depressed appearing.     Assessment & Plan:    Assessment > Diabetes --dx 03-2014 A1C 6.7 Atrial fibrillation: DX late 2015 cardioversion 04-2014, s/p xarelto ,Rx ASA ; went back  on Afib again 2017, saw cards 08-2015: rx to stay on ASA and rate control Erectile dysfunction: Related to beta blockers? Chronic rhinitis  PLAN: Diarrhea: Chronic, for the last 4 months, no red flag symptoms, he did have antibiotics few weeks prior to the onset of sx and then took a trip to [place].  Plan: Stool studies: Culture, WBCs, C. difficile and blood.  Further advised with results. A. fib: Saw cardiology 07/2017, recommend to continue aspirin for now, follow-up was recommended 1 year. RTC for CPX here, 1 month

## 2017-08-28 NOTE — Assessment & Plan Note (Signed)
Diarrhea: Chronic, for the last 4 months, no red flag symptoms, he did have antibiotics few weeks prior to the onset of sx and then took a trip to [place].  Plan: Stool studies: Culture, WBCs, C. difficile and blood.  Further advised with results. A. fib: Saw cardiology 07/2017, recommend to continue aspirin for now, follow-up was recommended 1 year. RTC for CPX here, 1 month

## 2017-08-29 ENCOUNTER — Other Ambulatory Visit: Payer: Self-pay

## 2017-08-29 DIAGNOSIS — R197 Diarrhea, unspecified: Secondary | ICD-10-CM

## 2017-08-31 ENCOUNTER — Other Ambulatory Visit (INDEPENDENT_AMBULATORY_CARE_PROVIDER_SITE_OTHER): Payer: Self-pay

## 2017-08-31 DIAGNOSIS — R197 Diarrhea, unspecified: Secondary | ICD-10-CM

## 2017-08-31 LAB — FECAL OCCULT BLOOD, IMMUNOCHEMICAL: Fecal Occult Bld: NEGATIVE

## 2017-09-02 LAB — STOOL CULTURE
MICRO NUMBER: 90794089
MICRO NUMBER: 90794090
MICRO NUMBER: 90794092
SHIGA RESULT:: NOT DETECTED
SPECIMEN QUALITY: ADEQUATE
SPECIMEN QUALITY: ADEQUATE
SPECIMEN QUALITY:: ADEQUATE

## 2017-09-02 LAB — FECAL LACTOFERRIN, QUANT
FECAL LACTOFERRIN: NEGATIVE
MICRO NUMBER: 90794091
SPECIMEN QUALITY:: ADEQUATE

## 2017-09-02 LAB — CLOSTRIDIUM DIFFICILE TOXIN B, QUALITATIVE, REAL-TIME PCR: CDIFFPCR: NOT DETECTED

## 2017-09-04 ENCOUNTER — Encounter: Payer: Self-pay | Admitting: Gastroenterology

## 2017-09-04 ENCOUNTER — Telehealth: Payer: Self-pay | Admitting: Internal Medicine

## 2017-09-04 DIAGNOSIS — R197 Diarrhea, unspecified: Secondary | ICD-10-CM

## 2017-09-04 NOTE — Telephone Encounter (Signed)
LMOM, all tests came back normal, since he had diarrhea for several months recommend GI evaluation. Please arrange a GI referral, DX diarrhea

## 2017-09-04 NOTE — Telephone Encounter (Signed)
Gastro referral placed

## 2017-09-04 NOTE — Telephone Encounter (Signed)
Copied from CRM [id number]. Topic: Quick Communication - See Telephone Encounter >> Sep 04, 2017  1:05 PM Arlyss Gandyichardson, Akesha Uresti N, NT wrote: CRM for notification. See Telephone encounter for: 09/04/17. Pt would like a call with his lab results. Interpreter services are needed for spanish.

## 2017-09-04 NOTE — Telephone Encounter (Signed)
Please advise 

## 2017-09-20 ENCOUNTER — Encounter: Payer: Self-pay | Admitting: Gastroenterology

## 2017-09-20 ENCOUNTER — Ambulatory Visit (INDEPENDENT_AMBULATORY_CARE_PROVIDER_SITE_OTHER): Payer: Self-pay | Admitting: Gastroenterology

## 2017-09-20 VITALS — BP 110/60 | HR 80 | Ht 71.0 in | Wt 211.4 lb

## 2017-09-20 DIAGNOSIS — R197 Diarrhea, unspecified: Secondary | ICD-10-CM

## 2017-09-20 DIAGNOSIS — Z1211 Encounter for screening for malignant neoplasm of colon: Secondary | ICD-10-CM

## 2017-09-20 MED ORDER — CHOLESTYRAMINE 4 G PO PACK: 4.0000 g | PACK | Freq: Two times a day (BID) | ORAL | 3 refills | Status: DC

## 2017-09-20 MED ORDER — NA SULFATE-K SULFATE-MG SULF 17.5-3.13-1.6 GM/177ML PO SOLN: 1.0000 | ORAL | 0 refills | Status: DC

## 2017-09-20 NOTE — Progress Notes (Signed)
09/20/2017 Sean Frye [id number] 10/02/1950   HISTORY OF PRESENT ILLNESS:  67 year old male originally from [place] who presents to our office today at the request of his PCP, Dr. Drue Novel, for evaluation of post-prandial diarrhea.  He had appendectomy and ileocecectomy in 03/2017 in [place] for ruptured appendix.  He was there for work at that time.  Since that time he has been having diarrhea after every time that he eats.  Denies nocturnal bowel movements.  Also had cholecystectomy in 2005.  Denies abdominal pain or blood in his stools.  Stools studies by PCP were negative.  Never had colonoscopy in the past.  Has persistent atrial fibrillation but is rate controlled and not on anticoagulation.  Patient is primarily Spanish speaking so communication was performed via interpreter (office staff).   Past Medical History:  Diagnosis Date  . Atrial fibrillation (HCC) 2015  . ED (erectile dysfunction) 06/10/2014  . Prediabetes    Past Surgical History:  Procedure Laterality Date  . APPENDECTOMY  03/2017   lap converted to exploratory laparotomy with ileocecectomy  . CARDIOVERSION N/A 05/14/2014   Procedure: CARDIOVERSION;  Surgeon: Chrystie Nose, MD;  Location: [facility] ENDOSCOPY;  Service: Cardiovascular;  Laterality: N/A;  . CHOLECYSTECTOMY  2005  . lipoma removal    . TEE WITHOUT CARDIOVERSION N/A 05/14/2014   Procedure: TRANSESOPHAGEAL ECHOCARDIOGRAM (TEE);  Surgeon: Chrystie Nose, MD;  Location: [facility] ENDOSCOPY;  Service: Cardiovascular;  Laterality: N/A;    reports that he has never smoked. He has never used smokeless tobacco. He reports that he does not drink alcohol or use drugs. family history includes Cirrhosis in his mother; Diabetes in his mother and sister; Prostate cancer in his father. No Known Allergies    Outpatient Encounter Medications as of 09/20/2017  Medication Sig  . aspirin EC 81 MG tablet Take 1 tablet (81 mg total) by mouth daily.  Marland Kitchen atorvastatin (LIPITOR) 20  MG tablet Take 1 tablet (20 mg total) by mouth daily.  . metoprolol succinate (TOPROL XL) 25 MG 24 hr tablet Take 0.5 tablets (12.5 mg total) by mouth daily.  . [DISCONTINUED] fluticasone (FLONASE) 50 MCG/ACT nasal spray Place 2 sprays into both nostrils daily. (Patient not taking: Reported on 08/27/2017)   No facility-administered encounter medications on file as of 09/20/2017.      REVIEW OF SYSTEMS  : All other systems reviewed and negative except where noted in the History of Present Illness.   PHYSICAL EXAM: BP 110/60   Pulse 80 Comment: slightly irregular  Ht 5\' 11"  (1.803 m)   Wt 211 lb 6.4 oz (95.9 kg)   BMI 29.48 kg/m  General: Well developed Hispanic male in no acute distress Head: Normocephalic and atraumatic Eyes:  Sclerae anicteric, conjunctiva pink. Ears: Normal auditory acuity Lungs: Clear throughout to ; no increased WOB. Heart: Irregularly irregular. Abdomen: Soft, non-distended.  BS present.  Non-tender.  Laparotomy scar noted and well healed. Rectal:  Will be done at the time of colonoscopy. Musculoskeletal: Symmetrical with no gross deformities  Skin: No lesions on visible extremities Extremities: No edema  Neurological: Alert oriented x 4, grossly non-focal Psychological:  Alert and cooperative. Normal mood and affect  ASSESSMENT AND PLAN: *Post-prandial diarrhea:  Is s/p cholecystectomy in 2005 but this issue has been present since appendectomy and ileocecectomy in 03/2017.  Will try questran 1 gram BID. *Screening colonoscopy:  Never had one in the past.  Will schedule for colonoscopy with Dr. Adela Lank.    **The  risks, benefits, and alternatives to colonoscopy were discussed with the patient and he consents to proceed.   CC:  Wanda PlumpPaz, Jose E, MD

## 2017-09-20 NOTE — Progress Notes (Signed)
Agree with assessment and plan as outlined.  

## 2017-09-20 NOTE — Patient Instructions (Signed)
We have sent the following medications to your pharmacy for you to pick up at your convenience:  Questran 4 mg twice a day   You have been scheduled for a colonoscopy. Please follow written instructions given to you at your visit today.  Please pick up your prep supplies at the pharmacy within the next 1-3 days. If you use inhalers (even only as needed), please bring them with you on the day of your procedure. Your physician has requested that you go to www.startemmi.com and enter the access code given to you at your visit today. This web site gives a general overview about your procedure. However, you should still follow specific instructions given to you by our office regarding your preparation for the procedure.

## 2017-10-11 ENCOUNTER — Encounter (HOSPITAL_COMMUNITY): Payer: Self-pay | Admitting: Emergency Medicine

## 2017-10-11 ENCOUNTER — Emergency Department (HOSPITAL_COMMUNITY)
Admission: EM | Admit: 2017-10-11 | Discharge: 2017-10-11 | Disposition: A | Discharge status: Home / Self Care | Payer: Worker's Compensation | Attending: Emergency Medicine | Admitting: Emergency Medicine

## 2017-10-11 DIAGNOSIS — Z79899 Other long term (current) drug therapy: Secondary | ICD-10-CM | POA: Insufficient documentation

## 2017-10-11 DIAGNOSIS — Y990 Civilian activity done for income or pay: Secondary | ICD-10-CM | POA: Insufficient documentation

## 2017-10-11 DIAGNOSIS — W228XXA Striking against or struck by other objects, initial encounter: Secondary | ICD-10-CM | POA: Insufficient documentation

## 2017-10-11 DIAGNOSIS — Y9389 Activity, other specified: Secondary | ICD-10-CM | POA: Diagnosis not present

## 2017-10-11 DIAGNOSIS — S0990XA Unspecified injury of head, initial encounter: Secondary | ICD-10-CM

## 2017-10-11 DIAGNOSIS — Z7982 Long term (current) use of aspirin: Secondary | ICD-10-CM | POA: Diagnosis not present

## 2017-10-11 DIAGNOSIS — Z9049 Acquired absence of other specified parts of digestive tract: Secondary | ICD-10-CM | POA: Insufficient documentation

## 2017-10-11 DIAGNOSIS — Y929 Unspecified place or not applicable: Secondary | ICD-10-CM | POA: Insufficient documentation

## 2017-10-11 DIAGNOSIS — S0101XA Laceration without foreign body of scalp, initial encounter: Secondary | ICD-10-CM

## 2017-10-11 MED ORDER — ACETAMINOPHEN 500 MG PO TABS: 500.0000 mg | ORAL_TABLET | Freq: Four times a day (QID) | ORAL | 0 refills | Status: DC | PRN

## 2017-10-11 NOTE — ED Provider Notes (Signed)
[facility] EMERGENCY DEPARTMENT Provider Note   CSN: [id number] Arrival date & time: 10/11/17  1255     History   Chief Complaint Chief Complaint  Patient presents with  . Head Laceration    HPI Sean Frye is a 67 y.o. male.  The history is provided by the patient. A language interpreter was used.  Head Laceration      67 year old male presenting for evaluation of scalp laceration. History obtained through [place] language interpreter. 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  Yesterday while at work, he was working in an area when he accidentally struck his head against a metal beam.  He denies any loss of consciousness but did suffer a laceration to his scalp.  It has been bleeding sporadically throughout the day which concerns him.  He endorsed nausea without vomiting.  He denies any vision changes, pain a 7 out of 10, no neck pain, no focal numbness or weakness.  He is not on any blood thinner medication.  No specific treatment tried at home.  He states that he is requesting for "cauterized my wound" as well as a work note.  Past Medical History:  Diagnosis Date  . Atrial fibrillation ([place]) 2015  . ED (erectile dysfunction) 06/10/2014  . Prediabetes     Patient Active Problem List   Diagnosis Date Noted  . Diarrhea 09/20/2017  . Plantar fasciitis 03/16/2016  . Dyslipidemia 12/16/2014  . PCP notes  ------------------------- 11/11/2014  . ED (erectile dysfunction) 06/10/2014  . Encounter for screening colonoscopy 04/07/2014  . Atrial fibrillation ([place]) 04/07/2014  . Hyperglycemia     Past Surgical History:  Procedure Laterality Date  . APPENDECTOMY  03/2017   lap converted to exploratory laparotomy with ileocecectomy  . CARDIOVERSION N/A 05/14/2014   Procedure: CARDIOVERSION;  Surgeon: Pixie Casino, MD;  Location[facility] END[facility]COPY;  Service: Cardiovascular;  Laterality: N/A;  . CHOLECYSTECTOMY  2005  . lipoma removal    . TEE WITHOUT  CARDIOVERSION N/A 05/14/2014   Procedure: TRANSESOPHAGEAL ECHOCARDIOGRAM (TEE);  Surgeon: Pixie Casino, MD;  Location: [facility] ENDOSCOPY;  Service: Cardiovascular;  Laterality: N/A;        Home Medications    Prior to Admission medications   Medication Sig Start Date End Date Taking? Authorizing Provider  aspirin EC 81 MG tablet Take 1 tablet (81 mg total) by mouth daily. 09/03/15   Hilty, Nadean Corwin, MD  atorvastatin (LIPITOR) 20 MG tablet Take 1 tablet (20 mg total) by mouth daily. 07/30/17   Hilty, Nadean Corwin, MD  cholestyramine (QUESTRAN) 4 g packet Take 1 packet (4 g total) by mouth 2 (two) times daily. 09/20/17   Zehr, Janett Billow D, PA-C  metoprolol succinate (TOPROL XL) 25 MG 24 hr tablet Take 0.5 tablets (12.5 mg total) by mouth daily. 07/30/17   Hilty, Nadean Corwin, MD  Na Sulfate-K Sulfate-Mg Sulf 17.5-3.13-1.6 GM/177ML SOLN Take 1 kit by mouth as directed. 09/20/17   Zehr, Laban Emperor, PA-C    Family History Family History  Problem Relation Age of Onset  . Diabetes Sister   . Diabetes Mother   . Cirrhosis Mother   . Prostate cancer Father        ? of cancer   . CAD Neg Hx   . Colon cancer Neg Hx     Social History Social History   Tobacco Use  . Smoking status: Never Smoker  . Smokeless tobacco: Never Used  Substance Use Topics  . Alcohol use: No  Alcohol/week: 0.0 standard drinks  . Drug use: No     Allergies   Patient has no known allergies.   Review of Systems Review of Systems  All other systems reviewed and are negative.    Physical Exam Updated Vital Signs BP 117/89 (BP Location: Right Arm)   Pulse 80   Temp 97.6 F (36.4 C) (Oral)   Resp 16   SpO2 100%   Physical Exam  Constitutional: He is oriented to person, place, and time. He appears well-developed and well-nourished. No distress.  HENT:  Head: Normocephalic.  Scalp: Superficial crescent-shaped laceration approximately 1 cm noted to left vertex of scalp mild tenderness to palpation, not actively  bleeding, no foreign body noted.  No crepitus and no bruising.  Eyes: Pupils are equal, round, and reactive to light. Conjunctivae and EOM are normal.  Neck: Normal range of motion. Neck supple.  No cervical midline spine tenderness  Musculoskeletal: Normal range of motion.  Neurological: He is alert and oriented to person, place, and time. No sensory deficit. GCS eye subscore is 4. GCS verbal subscore is 5. GCS motor subscore is 6.  Skin: No rash noted.  Psychiatric: He has a normal mood and affect.  Nursing note and vitals reviewed.    ED Treatments / Results  Labs (all labs ordered are listed, but only abnormal results are displayed) Labs Reviewed - No data to display  EKG None  Radiology No results found.  Procedures Procedures (including critical care time)  Medications Ordered in ED Medications - No data to display   Initial Impression / Assessment and Plan / ED Course  I have reviewed the triage vital signs and the nursing notes.  Pertinent labs & imaging results that were available during my care of the patient were reviewed by me and considered in my medical decision making (see chart for details).     BP 117/89 (BP Location: Right Arm)   Pulse 80   Temp 97.6 F (36.4 C) (Oral)   Resp 16   SpO2 100%    Final Clinical Impressions(s) / ED Diagnoses   Final diagnoses:  Laceration of scalp, initial encounter  Minor head injury, initial encounter    ED Discharge Orders         Ordered    acetaminophen (TYLENOL) 500 MG tablet  Every 6 hours PRN     10/11/17 1409         2:08 PM Minor head injury, small scalp laceration not amenable for laceration repair.  Mild concussive symptoms.  Wound was cleansed and dressed.  Work note provided.  Patient stable for discharge. Advance imaging discussed, we both felt it's not indicated at this time.    Domenic Moras, PA-C 10/11/17 1410    Fredia Sorrow, MD 10/17/17 [phone]

## 2017-10-11 NOTE — ED Triage Notes (Addendum)
Patient cut his scalp on some equipment at work yesterday, patient is Barrister's clerkairplane mechanic. Hemorrhage controlled. Patient alert, oriented, and in no apparent distress at this time.

## 2017-10-11 NOTE — ED Notes (Signed)
Patient verbalizes understanding of discharge instructions. Opportunity for questioning and answers were provided. Armband removed by staff, pt discharged from ED ambulatory.   

## 2017-10-23 ENCOUNTER — Encounter: Payer: Self-pay | Admitting: Internal Medicine

## 2017-11-05 ENCOUNTER — Encounter: Payer: Self-pay | Admitting: Gastroenterology

## 2017-11-26 ENCOUNTER — Encounter: Payer: Self-pay | Admitting: Internal Medicine

## 2017-12-03 ENCOUNTER — Encounter: Payer: Self-pay | Admitting: Internal Medicine

## 2017-12-03 ENCOUNTER — Telehealth: Payer: Self-pay

## 2017-12-03 DIAGNOSIS — Z0289 Encounter for other administrative examinations: Secondary | ICD-10-CM

## 2017-12-03 NOTE — Telephone Encounter (Signed)
Frequent no shows/cancellations.  12/03/2017-cpe-no show 11/26/2017-cpe- appts for 9:20am AND 1:20pm 11/05/2017-cpe-cancel 10/23/2017- cpe- cancel  Would you like to begin dismissal process?

## 2017-12-03 NOTE — Telephone Encounter (Signed)
Unfortunately he has frequent cancellations Please charge for this no show

## 2017-12-10 IMAGING — CR DG CHEST 2V
2 series · 2 of 2 positions shown · non-contrast
Comparison: PA and lateral chest x-ray May 08, 2014

CLINICAL DATA: Nausea and vomiting for the past 4 days, history of
atrial fibrillation, pre diabetes, nonsmoker.

EXAM:
CHEST  2 VIEW

[w chest pa]
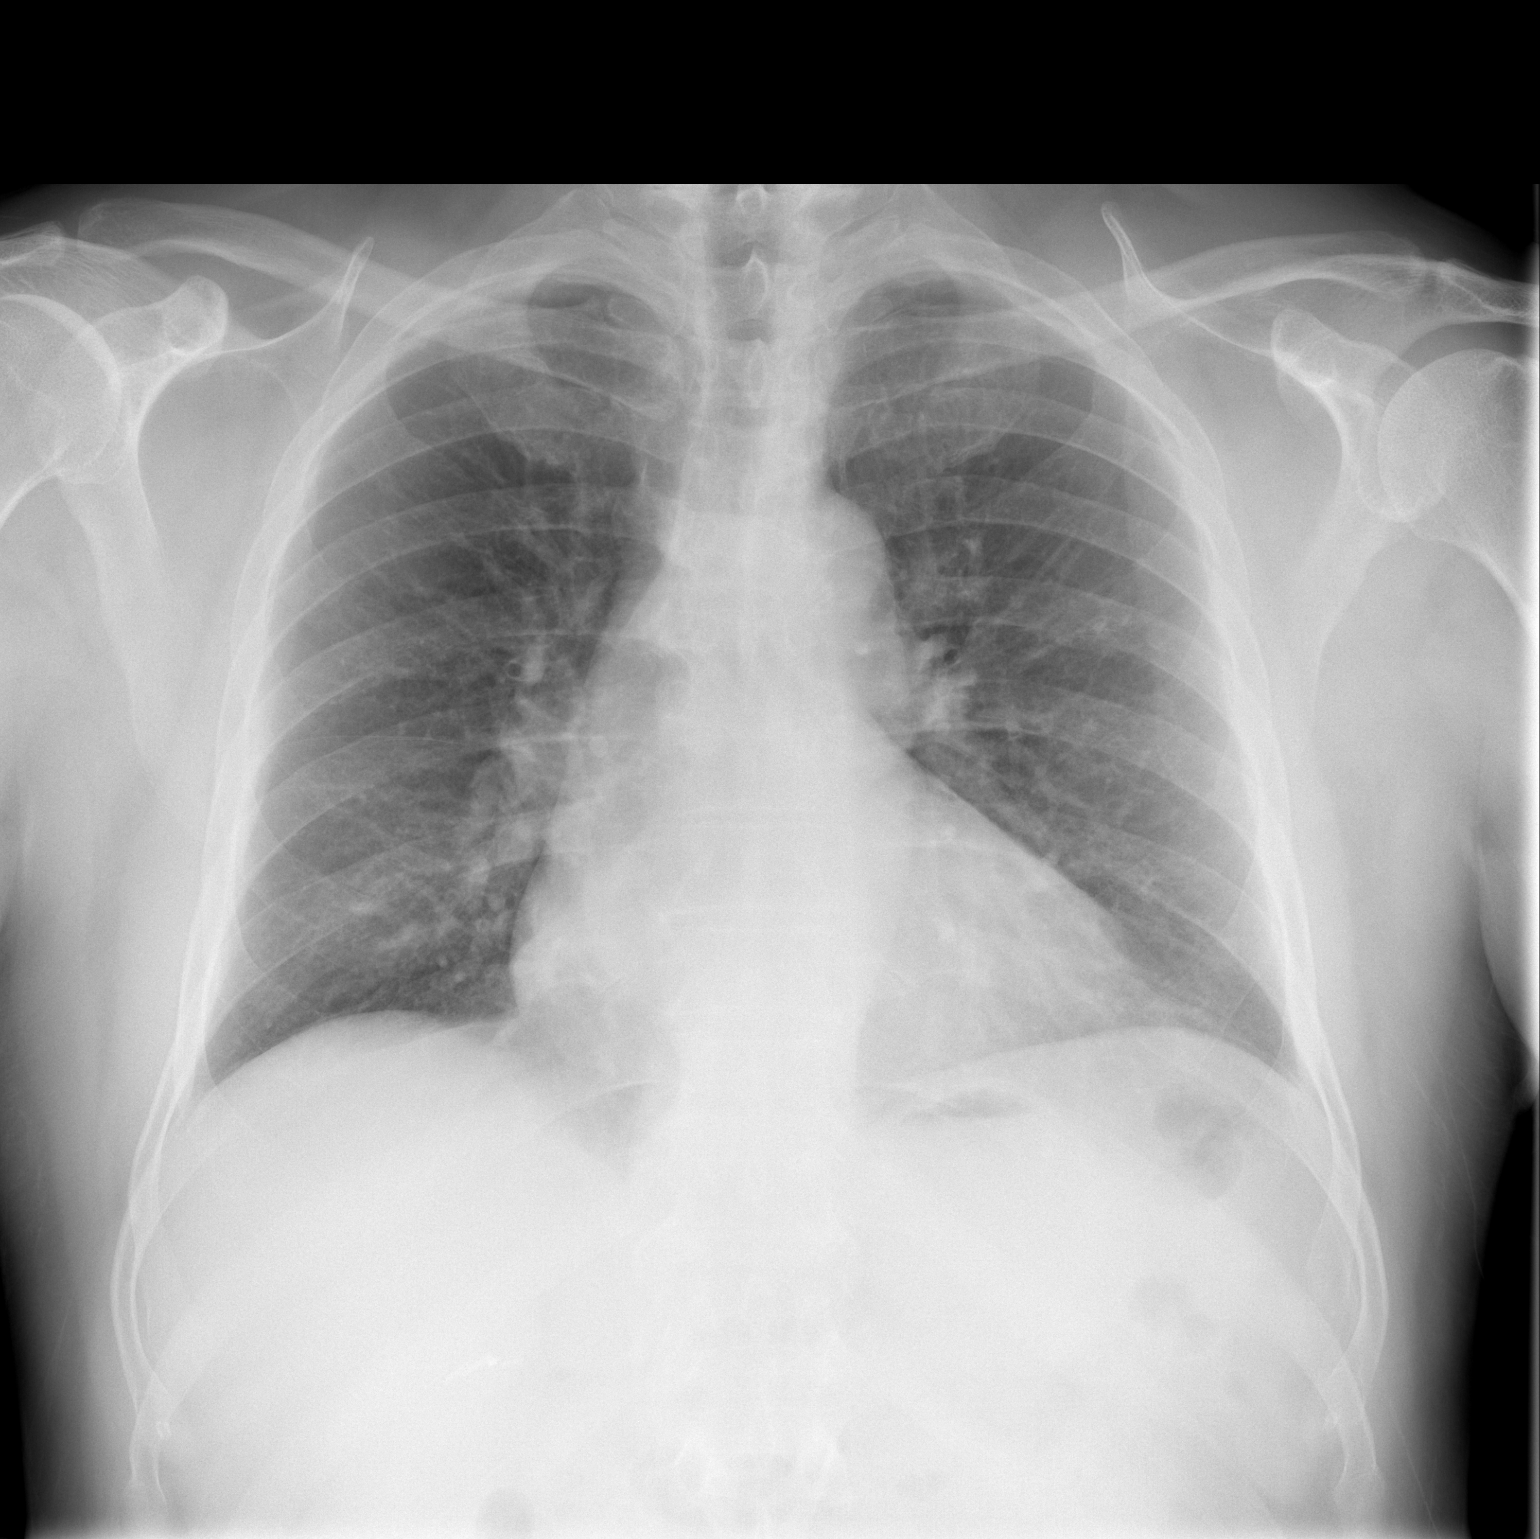

[w chest lat]
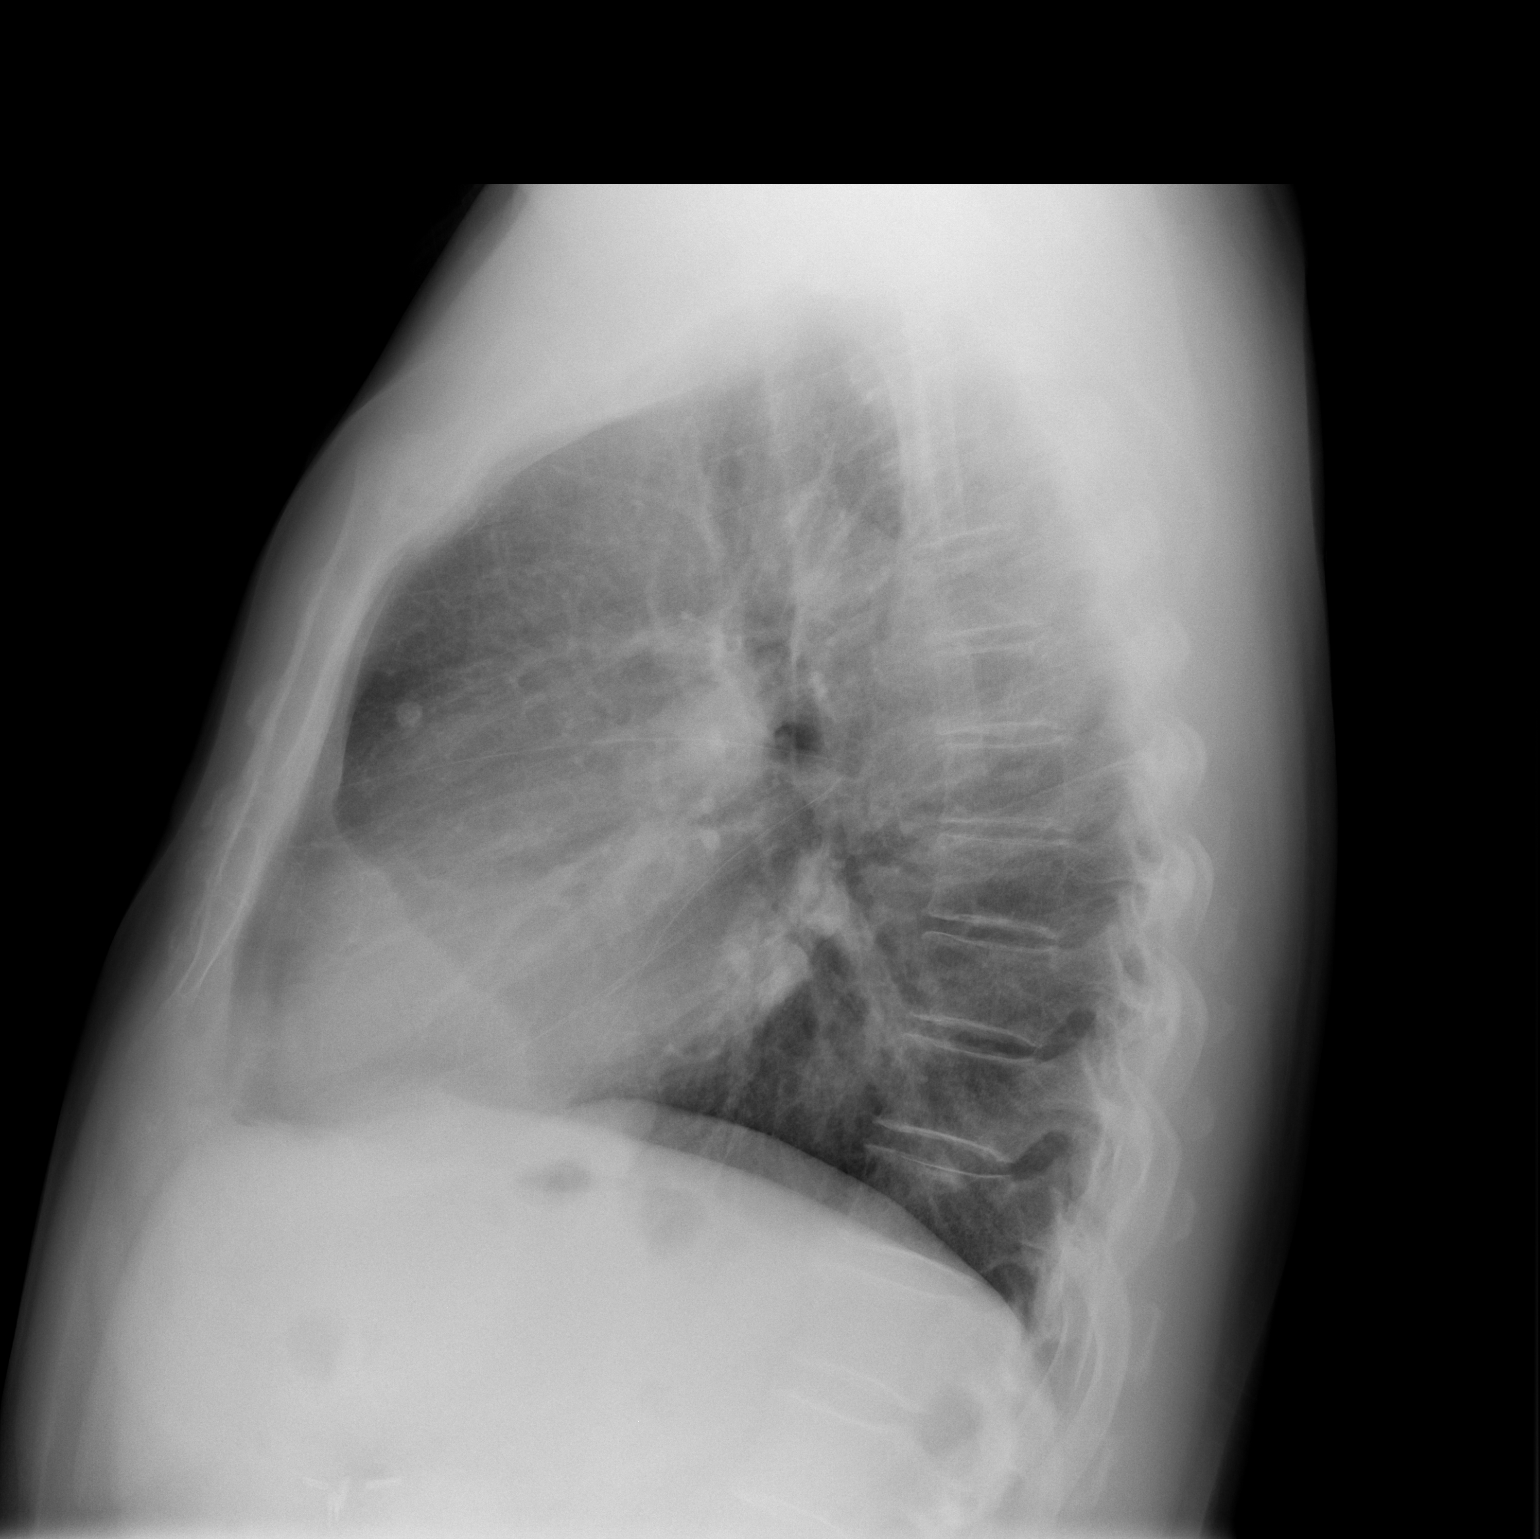

[2 of 2 positions shown; findings below may reference images not displayed]

FINDINGS: The lungs are adequately inflated. There is no focal infiltrate.
There is a calcified 7 mm nodule in the retrosternal region which is
stable. The heart and pulmonary vascularity are normal. The
mediastinum is normal in width. There is no pleural effusion. The
bony thorax exhibits no acute abnormality.
IMPRESSION: Evidence of previous granulomatous infection. No pneumonia, CHF, nor
other acute cardiopulmonary abnormality.

## 2017-12-11 ENCOUNTER — Encounter: Payer: Self-pay | Admitting: Internal Medicine

## 2017-12-20 ENCOUNTER — Telehealth: Payer: Self-pay | Admitting: Internal Medicine

## 2018-01-07 ENCOUNTER — Encounter: Payer: Self-pay | Admitting: Internal Medicine

## 2018-01-07 ENCOUNTER — Ambulatory Visit (INDEPENDENT_AMBULATORY_CARE_PROVIDER_SITE_OTHER): Payer: Self-pay | Admitting: Internal Medicine

## 2018-01-07 VITALS — BP 126/78 | HR 105 | Temp 97.9°F | Resp 16 | Ht 71.0 in | Wt 208.2 lb

## 2018-01-07 DIAGNOSIS — Z23 Encounter for immunization: Secondary | ICD-10-CM

## 2018-01-07 DIAGNOSIS — R739 Hyperglycemia, unspecified: Secondary | ICD-10-CM

## 2018-01-07 DIAGNOSIS — N402 Nodular prostate without lower urinary tract symptoms: Secondary | ICD-10-CM

## 2018-01-07 DIAGNOSIS — M65242 Calcific tendinitis, left hand: Secondary | ICD-10-CM

## 2018-01-07 DIAGNOSIS — Z0000 Encounter for general adult medical examination without abnormal findings: Secondary | ICD-10-CM

## 2018-01-07 LAB — COMPREHENSIVE METABOLIC PANEL
ALT: 24 U/L (ref 0–53)
AST: 16 U/L (ref 0–37)
Albumin: 4.1 g/dL (ref 3.5–5.2)
Alkaline Phosphatase: 77 U/L (ref 39–117)
BILIRUBIN TOTAL: 0.7 mg/dL (ref 0.2–1.2)
BUN: 16 mg/dL (ref 6–23)
CALCIUM: 8.6 mg/dL (ref 8.4–10.5)
CHLORIDE: 108 meq/L (ref 96–112)
CO2: 23 meq/L (ref 19–32)
Creatinine, Ser: 0.93 mg/dL (ref 0.40–1.50)
GFR: 86.36 mL/min (ref >60.00)
Glucose, Bld: 138 mg/dL — ABNORMAL HIGH (ref 70–99)
POTASSIUM: 3.6 meq/L (ref 3.5–5.1)
Sodium: 140 mEq/L (ref 135–145)
Total Protein: 7 g/dL (ref 6.0–8.3)

## 2018-01-07 LAB — TSH: TSH: 1.9 u[IU]/mL (ref 0.35–4.50)

## 2018-01-07 LAB — LDL CHOLESTEROL, DIRECT: Direct LDL: 115 mg/dL

## 2018-01-07 LAB — LIPID PANEL
CHOL/HDL RATIO: 4
Cholesterol: 158 mg/dL (ref 0–200)
HDL: 35.5 mg/dL — AB (ref >39.00)
NONHDL: 122.69
Triglycerides: 276 mg/dL — ABNORMAL HIGH (ref 0.0–149.0)
VLDL: 55.2 mg/dL — AB (ref 0.0–40.0)

## 2018-01-07 LAB — PSA: PSA: 2.37 ng/mL (ref 0.10–4.00)

## 2018-01-07 LAB — HEMOGLOBIN A1C: Hgb A1c MFr Bld: 6 % (ref 4.6–6.5)

## 2018-01-07 NOTE — Assessment & Plan Note (Addendum)
-   Td 2016, flu shot today shingrix #1 2018, #2 today -CCS: cscope in [place] 10/2017, no report (pt will get documentation), told no polyps - prostate ca screening: Abnormal DRE, has left-sided nodule.  Refer to urology, PSA. -Labs: CMP, FLP, A1c, TSH, PSA -Diet and exercise discussed

## 2018-01-07 NOTE — Progress Notes (Signed)
Subjective:    Patient ID: Sean Frye, male    DOB: 1950/11/18, 67 y.o.   MRN: [id number]  DOS:  01/07/2018 Type of visit - description : cpx Interval history: Here for CPX    Review of Systems Reports pain at the left thumb, worse at the dorsal aspect with some numbness depending on how he moves that thumb.  No trigger phenomena. Had diarrhea, see last visit, saw GI.  Subsequently was evaluated in [place]. Currently better   Other than above, a 14 point review of systems is negative    Past Medical History:  Diagnosis Date  . Atrial fibrillation ([facility]) 2015  . ED (erectile dysfunction) 06/10/2014  . Prediabetes     Past Surgical History:  Procedure Laterality Date  . APPENDECTOMY  03/2017   lap converted to exploratory laparotomy with ileocecectomy  . CARDIOVERSION N/A 05/14/2014   Procedure: CARDIOVERSION;  Surgeon: Chrystie Nose, MD;  Location: [facility] ENDOSCOPY;  Service: Cardiovascular;  Laterality: N/A;  . CHOLECYSTECTOMY  2005  . lipoma removal    . TEE WITHOUT CARDIOVERSION N/A 05/14/2014   Procedure: TRANSESOPHAGEAL ECHOCARDIOGRAM (TEE);  Surgeon: Chrystie Nose, MD;  Location: [facility] ENDOSCOPY;  Service: Cardiovascular;  Laterality: N/A;    Social History   Socioeconomic History  . Marital status: Single    Spouse name: Not on file  . Number of children: 5   . Years of education: Not on file  . Highest education level: Not on file  Occupational History  . Occupation: Barrister's clerk, [employer]  Social Needs  . Financial resource strain: Not on file  . Food insecurity:    Worry: Not on file    Inability: Not on file  . Transportation needs:    Medical: Not on file    Non-medical: Not on file  Tobacco Use  . Smoking status: Never Smoker  . Smokeless tobacco: Never Used  Substance and Sexual Activity  . Alcohol use: No    Alcohol/week: 0.0 standard drinks  . Drug use: No  . Sexual activity: Not on file  Lifestyle  . Physical activity:    Days per  week: Not on file    Minutes per session: Not on file  . Stress: Not on file  Relationships  . Social connections:    Talks on phone: Not on file    Gets together: Not on file    Attends religious service: Not on file    Active member of club or organization: Not on file    Attends meetings of clubs or organizations: Not on file    Relationship status: Not on file  . Intimate partner violence:    Fear of current or ex partner: Not on file    Emotionally abused: Not on file    Physically abused: Not on file    Forced sexual activity: Not on file  Other Topics Concern  . Not on file  Social History Narrative   Original from [place]   Lives w/ Mrs Darcel Bayley   Former professional soccer player     Family History  Problem Relation Age of Onset  . Diabetes Sister   . Diabetes Mother   . Cirrhosis Mother   . Prostate cancer Father   . CAD Neg Hx   . Colon cancer Neg Hx      Allergies as of 01/07/2018   No Known Allergies     Medication List        Accurate as of 01/07/18 11:59  PM. Always use your most recent med list.          aspirin EC 81 MG tablet Take 1 tablet (81 mg total) by mouth daily.   atorvastatin 20 MG tablet Commonly known as:  LIPITOR Take 1 tablet (20 mg total) by mouth daily.   metoprolol succinate 25 MG 24 hr tablet Commonly known as:  TOPROL-XL Take 0.5 tablets (12.5 mg total) by mouth daily.          Objective:   Physical Exam  Musculoskeletal:       Hands:  BP 126/78 (BP Location: Left Arm, Patient Position: Sitting, Cuff Size: Small)   Pulse (!) 105   Temp 97.9 F (36.6 C) (Oral)   Resp 16   Ht 5\' 11"  (1.803 m)   Wt 208 lb 4 oz (94.5 kg)   SpO2 98%   BMI 29.04 kg/m  General: Well developed, NAD, BMI noted Neck: No  thyromegaly  HEENT:  Normocephalic . Face symmetric, atraumatic Lungs:  CTA B Normal respiratory effort, no intercostal retractions, no accessory muscle use. Heart: Seems regular on clinical grounds, no murmur.   No pretibial edema bilaterally  Abdomen:  Not distended, soft, non-tender. No rebound or rigidity.   Skin: Exposed areas without rash. Not pale. Not jaundice Rectal: External abnormalities: none. Normal sphincter tone. No rectal masses or tenderness.  No  stools Prostate: Prostate gland firm and smooth, no enlargement, he does have a nodule on the left prostate, approximately 1 cm.  The rest of the gland is soft and nontender. Neurologic:  alert & oriented X3.  Speech normal, gait appropriate for age and unassisted Strength symmetric and appropriate for age.  Psych: Cognition and judgment appear intact.  Cooperative with normal attention span and concentration.  Behavior appropriate. No anxious or depressed appearing.     Assessment & Plan:    Assessment   Diabetes --dx 03-2014 A1C 6.7 Atrial fibrillation: DX late 2015 cardioversion 04-2014, s/p xarelto ,Rx ASA ; went back  on Afib again 2017, f/u by cards, rec ASA and rate control for now Erectile dysfunction: Related to beta blockers? Chronic rhinitis  PLAN: DM: diet controlled, check A1c Diarrhea: See last visit, saw GI in [place], they recommended a colonoscopy and prescribed Questran which she tried temporarily but caused side effects such as heartburn.  The colonoscopy was never done. Then he went to visit [place] in September 2019, saw GI there, had a colonoscopy reportedly negative.  Was prescribed "3 medications" for diarrhea and that has resolved. Atrial fibrillation: Persistent (although seems regular on exam today), next visit with cardiology 07/2018, current strategies aspirin and metoprolol. Left thumb tendinitis?  See above, refer to Ortho hand RTC  6 m

## 2018-01-07 NOTE — Progress Notes (Signed)
Pre visit review using our clinic review tool, if applicable. No additional management support is needed unless otherwise documented below in the visit note. 

## 2018-01-07 NOTE — Patient Instructions (Addendum)
GO TO THE LAB : Get the blood work     GO TO THE FRONT DESK Schedule your next appointment for a   checkup in 6 months 

## 2018-01-08 NOTE — Assessment & Plan Note (Signed)
DM: diet controlled, check A1c Diarrhea: See last visit, saw GI in [place], they recommended a colonoscopy and prescribed Questran which she tried temporarily but caused side effects such as heartburn.  The colonoscopy was never done. Then he went to visit [place] in September 2019, saw GI there, had a colonoscopy reportedly negative.  Was prescribed "3 medications" for diarrhea and that has resolved. Atrial fibrillation: Persistent (although seems regular on exam today), next visit with cardiology 07/2018, current strategies aspirin and metoprolol. Left thumb tendinitis?  See above, refer to Ortho hand RTC  6 m

## 2018-01-10 ENCOUNTER — Other Ambulatory Visit: Payer: Self-pay

## 2018-01-10 MED ORDER — ATORVASTATIN CALCIUM 40 MG PO TABS: 40.0000 mg | ORAL_TABLET | Freq: Every day | ORAL | 2 refills | Status: DC

## 2018-03-11 ENCOUNTER — Ambulatory Visit (INDEPENDENT_AMBULATORY_CARE_PROVIDER_SITE_OTHER): Payer: BLUE CROSS/BLUE SHIELD | Admitting: Podiatry

## 2018-03-11 DIAGNOSIS — M722 Plantar fascial fibromatosis: Secondary | ICD-10-CM

## 2018-03-11 DIAGNOSIS — M79672 Pain in left foot: Secondary | ICD-10-CM | POA: Diagnosis not present

## 2018-03-11 NOTE — Patient Instructions (Signed)
Pre-Operative Instructions  Congratulations, you have decided to take an important step towards improving your quality of life.  You can be assured that the doctors and staff at [facility] will be with you every step of the way.  Here are some important things you should know:  1. Plan to be at the surgery center/hospital at least 1 (one) hour prior to your scheduled time, unless otherwise directed by the surgical center/hospital staff.  You must have a responsible adult accompany you, remain during the surgery and drive you home.  Make sure you have directions to the surgical center/hospital to ensure you arrive on time. 2. If you are having surgery at [facility] or [facility] hospitals, you will need a copy of your medical history and physical form from your family physician within one month prior to the date of surgery. We will give you a form for your primary physician to complete.  3. We make every effort to accommodate the date you request for surgery.  However, there are times where surgery dates or times have to be moved.  We will contact you as soon as possible if a change in schedule is required.   4. No aspirin/ibuprofen for one week before surgery.  If you are on aspirin, any non-steroidal anti-inflammatory medications (Mobic, Aleve, Ibuprofen) should not be taken seven (7) days prior to your surgery.  You make take Tylenol for pain prior to surgery.  5. Medications - If you are taking daily heart and blood pressure medications, seizure, reflux, allergy, asthma, anxiety, pain or diabetes medications, make sure you notify the surgery center/hospital before the day of surgery so they can tell you which medications you should take or avoid the day of surgery. 6. No food or drink after midnight the night before surgery unless directed otherwise by surgical center/hospital staff. 7. No alcoholic beverages 24-hours prior to surgery.  No smoking 24-hours prior or 24-hours after  surgery. 8. Wear loose pants or shorts. They should be loose enough to fit over bandages, boots, and casts. 9. Don't wear slip-on shoes. Sneakers are preferred. 10. Bring your boot with you to the surgery center/hospital.  Also bring crutches or a walker if your physician has prescribed it for you.  If you do not have this equipment, it will be provided for you after surgery. 11. If you have not been contacted by the surgery center/hospital by the day before your surgery, call to confirm the date and time of your surgery. 12. Leave-time from work may vary depending on the type of surgery you have.  Appropriate arrangements should be made prior to surgery with your employer. 13. Prescriptions will be provided immediately following surgery by your doctor.  Fill these as soon as possible after surgery and take the medication as directed. Pain medications will not be refilled on weekends and must be approved by the doctor. 14. Remove nail polish on the operative foot and avoid getting pedicures prior to surgery. 15. Wash the night before surgery.  The night before surgery wash the foot and leg well with water and the antibacterial soap provided. Be sure to pay special attention to beneath the toenails and in between the toes.  Wash for at least three (3) minutes. Rinse thoroughly with water and dry well with a towel.  Perform this wash unless told not to do so by your physician.  Enclosed: 1 Ice pack (please put in freezer the night before surgery)   1 Hibiclens skin cleaner     Pre-op instructions  If you have any questions regarding the instructions, please do not hesitate to call our office.  [place]: [address], [place], [place] [address] -- [phone]  [place]: [address] [place], [place] [address] -- [phone]  [place]: [address]  [place], [place] [address] -- [phone]  [place]: [address], [address], [place],  [address] -- [phone]  Website:  [email or web address] 

## 2018-03-13 NOTE — Progress Notes (Signed)
   Subjective: 69 year old male presenting today for follow up evaluation of left foot pain. He states the pain has not improved since his previous visit to the clinic. He has purchased [employer] but states he has had continued pain. He has used custom orthotics and received steroid injections in the past with some relief. Walking for long periods of time increases the pain. Patient is here for further evaluation and treatment.   Past Medical History:  Diagnosis Date  . Atrial fibrillation ([facility]) 2015  . ED (erectile dysfunction) 06/10/2014  . Prediabetes      Objective: Physical Exam General: The patient is alert and oriented x3 in no acute distress.  Dermatology: Skin is warm, dry and supple bilateral lower extremities. Negative for open lesions or macerations bilateral.   Vascular: Dorsalis Pedis and Posterior Tibial pulses palpable bilateral.  Capillary fill time is immediate to all digits.  Neurological: Epicritic and protective threshold intact bilateral.   Musculoskeletal: Tenderness to palpation to the plantar aspect of the left heel along the plantar fascia. All other joints range of motion within normal limits bilateral. Strength 5/5 in all groups bilateral.   Assessment: 1. Plantar fasciitis left foot - chronic  Plan of Care:  1. Patient evaluated.  2. Injection of 0.5cc Celestone soluspan injected into the left plantar fascia.  3. Today we discussed the conservative versus surgical management of the presenting pathology. The patient opts for surgical management. All possible complications and details of the procedure were explained. All patient questions were answered. No guarantees were expressed or implied. 4. Authorization for surgery was initiated today. Surgery will consist of EPF left.  5. Return to clinic one week post op.   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     Felecia Shelling, DPM [facility]  Dr. Felecia Shelling, DPM    [address] [place], [place] [address]                Office [phone]  Fax [phone]

## 2018-03-18 ENCOUNTER — Encounter: Payer: Self-pay | Admitting: Internal Medicine

## 2018-03-18 ENCOUNTER — Ambulatory Visit (INDEPENDENT_AMBULATORY_CARE_PROVIDER_SITE_OTHER): Payer: BLUE CROSS/BLUE SHIELD | Admitting: Internal Medicine

## 2018-03-18 VITALS — BP 128/78 | HR 93 | Temp 97.8°F | Resp 16 | Ht 71.0 in | Wt 207.4 lb

## 2018-03-18 DIAGNOSIS — E785 Hyperlipidemia, unspecified: Secondary | ICD-10-CM

## 2018-03-18 DIAGNOSIS — M779 Enthesopathy, unspecified: Secondary | ICD-10-CM | POA: Diagnosis not present

## 2018-03-18 DIAGNOSIS — K580 Irritable bowel syndrome with diarrhea: Secondary | ICD-10-CM

## 2018-03-18 DIAGNOSIS — R739 Hyperglycemia, unspecified: Secondary | ICD-10-CM | POA: Diagnosis not present

## 2018-03-18 DIAGNOSIS — N402 Nodular prostate without lower urinary tract symptoms: Secondary | ICD-10-CM | POA: Diagnosis not present

## 2018-03-18 MED ORDER — OMEPRAZOLE 40 MG PO CPDR: 40.0000 mg | DELAYED_RELEASE_CAPSULE | Freq: Every day | ORAL | 10 refills | Status: AC

## 2018-03-18 MED ORDER — DICYCLOMINE HCL 20 MG PO TABS: 20.0000 mg | ORAL_TABLET | Freq: Three times a day (TID) | ORAL | 3 refills | Status: DC

## 2018-03-18 NOTE — Progress Notes (Signed)
Subjective:    Patient ID: Sean Frye, male    DOB: 1950/10/30, 68 y.o.   MRN: [id number]  DOS:  03/18/2018 Type of visit - description: f/u Prostate nodule: Has not seen urology, "I was never called". Has not seen the hand surgeon. High cholesterol: Good compliance with Lipitor. His main concern today is diarrhea, it has resurfaced.  See below. GERD: Taking omeprazole OTC 20 mg daily and still has breakthrough symptoms.  Wt Readings from Last 3 Encounters:  03/18/18 207 lb 6 oz (94.1 kg)  01/07/18 208 lb 4 oz (94.5 kg)  09/20/17 211 lb 6.4 oz (95.9 kg)     Review of Systems Denies chest pain, shortness of breath palpitations.  No edema No nausea vomiting No blood in the stools.  Past Medical History:  Diagnosis Date  . Atrial fibrillation (HCC) 2015  . ED (erectile dysfunction) 06/10/2014  . Prediabetes     Past Surgical History:  Procedure Laterality Date  . APPENDECTOMY  03/2017   lap converted to exploratory laparotomy with ileocecectomy  . CARDIOVERSION N/A 05/14/2014   Procedure: CARDIOVERSION;  Surgeon: Chrystie Nose, MD;  Location: [facility] ENDOSCOPY;  Service: Cardiovascular;  Laterality: N/A;  . CHOLECYSTECTOMY  2005  . lipoma removal    . TEE WITHOUT CARDIOVERSION N/A 05/14/2014   Procedure: TRANSESOPHAGEAL ECHOCARDIOGRAM (TEE);  Surgeon: Chrystie Nose, MD;  Location: [facility] ENDOSCOPY;  Service: Cardiovascular;  Laterality: N/A;    Social History   Socioeconomic History  . Marital status: Single    Spouse name: Not on file  . Number of children: 5   . Years of education: Not on file  . Highest education level: Not on file  Occupational History  . Occupation: Barrister's clerk, [employer]  Social Needs  . Financial resource strain: Not on file  . Food insecurity:    Worry: Not on file    Inability: Not on file  . Transportation needs:    Medical: Not on file    Non-medical: Not on file  Tobacco Use  . Smoking status: Never Smoker  . Smokeless  tobacco: Never Used  Substance and Sexual Activity  . Alcohol use: No    Alcohol/week: 0.0 standard drinks  . Drug use: No  . Sexual activity: Not on file  Lifestyle  . Physical activity:    Days per week: Not on file    Minutes per session: Not on file  . Stress: Not on file  Relationships  . Social connections:    Talks on phone: Not on file    Gets together: Not on file    Attends religious service: Not on file    Active member of club or organization: Not on file    Attends meetings of clubs or organizations: Not on file    Relationship status: Not on file  . Intimate partner violence:    Fear of current or ex partner: Not on file    Emotionally abused: Not on file    Physically abused: Not on file    Forced sexual activity: Not on file  Other Topics Concern  . Not on file  Social History Narrative   Original from [place]   Lives w/ Mrs Darcel Bayley   Former professional soccer player      Allergies as of 03/18/2018   No Known Allergies     Medication List       Accurate as of March 18, 2018 11:59 PM. Always use your most recent med list.  aspirin EC 81 MG tablet Take 1 tablet (81 mg total) by mouth daily.   atorvastatin 40 MG tablet Commonly known as:  LIPITOR Take 1 tablet (40 mg total) by mouth daily.   dicyclomine 20 MG tablet Commonly known as:  BENTYL Take 1 tablet (20 mg total) by mouth 3 (three) times daily before meals.   metoprolol succinate 25 MG 24 hr tablet Commonly known as:  TOPROL XL Take 0.5 tablets (12.5 mg total) by mouth daily.   omeprazole 40 MG capsule Commonly known as:  PRILOSEC Take 1 capsule (40 mg total) by mouth daily.           Objective:   Physical Exam BP 128/78 (BP Location: Left Arm, Patient Position: Sitting, Cuff Size: Normal)   Pulse 93   Temp 97.8 F (36.6 C) (Oral)   Resp 16   Ht 5\' 11"  (1.803 m)   Wt 207 lb 6 oz (94.1 kg)   SpO2 96%   BMI 28.92 kg/m  General:   Well developed, NAD, BMI noted.    HEENT:  Normocephalic . Face symmetric, atraumatic Lungs:  CTA B Normal respiratory effort, no intercostal retractions, no accessory muscle use. Heart: Irregularly irregular,  no murmur.  no pretibial edema bilaterally  Abdomen:  Not distended, soft, non-tender. No rebound or rigidity.   Skin: Not pale. Not jaundice Neurologic:  alert & oriented X3.  Speech normal, gait appropriate for age and unassisted Psych--  Cognition and judgment appear intact.  Cooperative with normal attention span and concentration.  Behavior appropriate. No anxious or depressed appearing.     Assessment      Assessment   Diabetes --dx 03-2014 A1C 6.7 Atrial fibrillation: DX late 2015 cardioversion 04-2014, s/p xarelto ,Rx ASA ; went back  on Afib again 2017, f/u by cards, rec ASA and rate control for now Erectile dysfunction: Related to beta blockers? Chronic rhinitis IBS  PLAN: IBS: The patient brought me a report of a colonoscopy made in [place] 11/15/2017, report is in Spanish, no polyps, left colon and sigmoid colon noted to be spastic.  No biopsy was taken.  Was diagnosed with "spastic colon".  Initially was prescribe: -IFAXIN, Brotilonio, Ulcimet, ?probiotic, peptobismol Subsequently was prescribed Pepto-Bismol and Brotilonio (a medication I am not familiar with but is an antispasmodic). Since he ran out of those 2 meds and has developed diarrhea again. My impression is that he has IBS- diarrhea predominant that responded well to Pepto-Bismol and a antispasmodic. Plan: OTC Pepto-Bismol, prescribed Bentyl.  If not better let me know GERD: Currently on omeprazole 20 mg with breakthrough symptoms.  Prescription sent for omeprazole 40 mg. Left prostate nodule: Not seen by urology, will refer again.  Explained that if he does not hear from them he needs to contact my office. High cholesterol: Based on last FLP, Lipitor was increased to 40 mg.  Check a FLP, AST ALT (no fasting). Left hand  tendinitis: Has not seen Ortho just yet, new referral sent RTC 4 months.  F2F> 25 min

## 2018-03-18 NOTE — Progress Notes (Signed)
Pre visit review using our clinic review tool, if applicable. No additional management support is needed unless otherwise documented below in the visit note. 

## 2018-03-18 NOTE — Patient Instructions (Addendum)
GO TO THE LAB : Get the blood work     GO TO THE FRONT DESK Schedule your next appointment     Para la diarrhea: Pepto-Bismol Dicyclomine 3 veces al dia con las comidas   Lo estamos refiriendo a urologia, si no lo llaman dentro de los siguientes 10 dias, llameno a la clinica   Omeprazole 40 mg: uno antes del desayuno

## 2018-03-19 DIAGNOSIS — K589 Irritable bowel syndrome without diarrhea: Secondary | ICD-10-CM | POA: Insufficient documentation

## 2018-03-19 LAB — LIPID PANEL
CHOL/HDL RATIO: 3
Cholesterol: 115 mg/dL (ref 0–200)
HDL: 39.1 mg/dL (ref >39.00)
NONHDL: 75.67
Triglycerides: 221 mg/dL — ABNORMAL HIGH (ref 0.0–149.0)
VLDL: 44.2 mg/dL — AB (ref 0.0–40.0)

## 2018-03-19 LAB — AST: AST: 18 U/L (ref 0–37)

## 2018-03-19 LAB — LDL CHOLESTEROL, DIRECT: Direct LDL: 54 mg/dL

## 2018-03-19 LAB — ALT: ALT: 25 U/L (ref 0–53)

## 2018-03-19 NOTE — Assessment & Plan Note (Signed)
IBS: The patient brought me a report of a colonoscopy made in [place] 11/15/2017, report is in Spanish, no polyps, left colon and sigmoid colon noted to be spastic.  No biopsy was taken.  Was diagnosed with "spastic colon".  Initially was prescribe: -IFAXIN, Brotilonio, Ulcimet, ?probiotic, peptobismol Subsequently was prescribed Pepto-Bismol and Brotilonio (a medication I am not familiar with but is an antispasmodic). Since he ran out of those 2 meds and has developed diarrhea again. My impression is that he has IBS- diarrhea predominant that responded well to Pepto-Bismol and a antispasmodic. Plan: OTC Pepto-Bismol, prescribed Bentyl.  If not better let me know GERD: Currently on omeprazole 20 mg with breakthrough symptoms.  Prescription sent for omeprazole 40 mg. Left prostate nodule: Not seen by urology, will refer again.  Explained that if he does not hear from them he needs to contact my office. High cholesterol: Based on last FLP, Lipitor was increased to 40 mg.  Check a FLP, AST ALT (no fasting). Left hand tendinitis: Has not seen Ortho just yet, new referral sent RTC 4 months.  IBS

## 2018-04-30 DIAGNOSIS — Z0000 Encounter for general adult medical examination without abnormal findings: Secondary | ICD-10-CM | POA: Diagnosis not present

## 2018-05-06 DIAGNOSIS — R972 Elevated prostate specific antigen [PSA]: Secondary | ICD-10-CM | POA: Diagnosis not present

## 2018-05-08 ENCOUNTER — Telehealth: Payer: Self-pay | Admitting: *Deleted

## 2018-05-08 NOTE — Telephone Encounter (Signed)
Postop visits have been rescheduled. °

## 2018-05-08 NOTE — Telephone Encounter (Signed)
"  Patient says he needs to reschedule. Can you forward to office and have them call him?"  Thanks, Kellie Moor RN [facility] Pre Anesthesia Assessment Nurse ------------------------------------------------------------------------------------------------------------------------------------------------------------  I am calling you in regards to your surgery.  I was informed that you want to reschedule your surgery.  "Yes, I don't want surgery on March 19.  They canceled it."  Do you want to reschedule it?  "Yes, in two months.  Can you leave me a message?  I am at work right now."  Yes, I'll leave you a message.  I attempted to call his wife.  I left her a message that I am rescheduling his surgery March 19 to May 14.  I asked her to call if she has any questions.

## 2018-05-11 ENCOUNTER — Emergency Department (INDEPENDENT_AMBULATORY_CARE_PROVIDER_SITE_OTHER)
Admission: EM | Admit: 2018-05-11 | Discharge: 2018-05-11 | Disposition: A | Discharge status: Home / Self Care | Payer: BLUE CROSS/BLUE SHIELD | Source: Home / Self Care | Attending: Family Medicine | Admitting: Family Medicine

## 2018-05-11 ENCOUNTER — Other Ambulatory Visit: Payer: Self-pay

## 2018-05-11 ENCOUNTER — Encounter: Payer: Self-pay | Admitting: Emergency Medicine

## 2018-05-11 DIAGNOSIS — J069 Acute upper respiratory infection, unspecified: Secondary | ICD-10-CM | POA: Diagnosis not present

## 2018-05-11 DIAGNOSIS — B9789 Other viral agents as the cause of diseases classified elsewhere: Secondary | ICD-10-CM | POA: Diagnosis not present

## 2018-05-11 MED ORDER — AZITHROMYCIN 250 MG PO TABS: ORAL_TABLET | ORAL | 0 refills | Status: DC

## 2018-05-11 MED ORDER — BENZONATATE 200 MG PO CAPS: ORAL_CAPSULE | ORAL | 0 refills | Status: DC

## 2018-05-11 NOTE — ED Provider Notes (Signed)
[name]    CSN: [id number] Arrival date & time: 05/11/18  1011     History   Chief Complaint Chief Complaint  Patient presents with  . Cough  . Nasal Congestion    HPI Sean Frye is a 68 y.o. male.   Patient is from [place].  He complains of 10 day history of typical cold-like symptoms developing over several days, including mild sore throat, sinus congestion, chills, fatigue, and cough.  His cough has persisted but he does not feel ill.  He denies chest pain and shortness of breath    The history is provided by the patient. A language interpreter was used.    Past Medical History:  Diagnosis Date  . Atrial fibrillation (HCC) 2015  . ED (erectile dysfunction) 06/10/2014  . Prediabetes     Patient Active Problem List   Diagnosis Date Noted  . IBS (irritable bowel syndrome) 03/19/2018  . Plantar fasciitis 03/16/2016  . Dyslipidemia 12/16/2014  . PCP notes  ------------------------- 11/11/2014  . ED (erectile dysfunction) 06/10/2014  . Annual physical exam 04/07/2014  . Atrial fibrillation (HCC) 04/07/2014  . Hyperglycemia     Past Surgical History:  Procedure Laterality Date  . APPENDECTOMY  03/2017   lap converted to exploratory laparotomy with ileocecectomy  . CARDIOVERSION N/A 05/14/2014   Procedure: CARDIOVERSION;  Surgeon: Chrystie Nose, MD;  Location: [facility] ENDOSCOPY;  Service: Cardiovascular;  Laterality: N/A;  . CHOLECYSTECTOMY  2005  . lipoma removal    . TEE WITHOUT CARDIOVERSION N/A 05/14/2014   Procedure: TRANSESOPHAGEAL ECHOCARDIOGRAM (TEE);  Surgeon: Chrystie Nose, MD;  Location: [facility] ENDOSCOPY;  Service: Cardiovascular;  Laterality: N/A;       Home Medications    Prior to Admission medications   Medication Sig Start Date End Date Taking? Authorizing Provider  aspirin EC 81 MG tablet Take 1 tablet (81 mg total) by mouth daily. 09/03/15   Hilty, Lisette Abu, MD  atorvastatin (LIPITOR) 40 MG tablet Take 1 tablet (40 mg total) by  mouth daily. 01/10/18   Wanda Plump, MD  azithromycin (ZITHROMAX Z-PAK) 250 MG tablet Take 2 tabs today; then begin one tab once daily for 4 more days. (Rx void after 05/19/18) 05/11/18   Lattie Haw, MD  benzonatate (TESSALON) 200 MG capsule Take one cap by mouth at bedtime as needed for cough.  May repeat in 4 to 6 hours 05/11/18   Lattie Haw, MD  dicyclomine (BENTYL) 20 MG tablet Take 1 tablet (20 mg total) by mouth 3 (three) times daily before meals. 03/18/18   Wanda Plump, MD  metoprolol succinate (TOPROL XL) 25 MG 24 hr tablet Take 0.5 tablets (12.5 mg total) by mouth daily. 07/30/17   Hilty, Lisette Abu, MD  omeprazole (PRILOSEC) 40 MG capsule Take 1 capsule (40 mg total) by mouth daily. 03/18/18   Wanda Plump, MD    Family History Family History  Problem Relation Age of Onset  . Diabetes Sister   . Diabetes Mother   . Cirrhosis Mother   . Prostate cancer Father   . CAD Neg Hx   . Colon cancer Neg Hx     Social History Social History   Tobacco Use  . Smoking status: Never Smoker  . Smokeless tobacco: Never Used  Substance Use Topics  . Alcohol use: No    Alcohol/week: 0.0 standard drinks  . Drug use: No     Allergies   Patient has no known allergies.  Review of Systems Review of Systems + sore throat + cough No pleuritic pain No wheezing + nasal congestion + post-nasal drainage No sinus pain/pressure No itchy/red eyes No earache No hemoptysis No SOB No fever/chills No nausea No vomiting No abdominal pain No diarrhea No urinary symptoms No skin rash No fatigue No myalgias No headache Used OTC meds without relief   Physical Exam Triage Vital Signs ED Triage Vitals  Enc Vitals Group     BP 05/11/18 1057 102/68     Pulse Rate 05/11/18 1057 91     Resp 05/11/18 1057 18     Temp 05/11/18 1057 97.7 F (36.5 C)     Temp Source 05/11/18 1057 Oral     SpO2 05/11/18 1057 98 %     Weight 05/11/18 1059 207 lb 3.7 oz (94 kg)     Height 05/11/18  1059 5\' 11"  (1.803 m)     Head Circumference --      Peak Flow --      Pain Score 05/11/18 1059 0     Pain Loc --      Pain Edu? --      Excl. in GC? --    No data found.  Updated Vital Signs BP 102/68 (BP Location: Right Arm)   Pulse 91   Temp 97.7 F (36.5 C) (Oral)   Resp 18   Ht 5\' 11"  (1.803 m)   Wt 94 kg   SpO2 98%   BMI 28.90 kg/m   Visual Acuity Right Eye Distance:   Left Eye Distance:   Bilateral Distance:    Right Eye Near:   Left Eye Near:    Bilateral Near:     Physical Exam Nursing notes and Vital Signs reviewed. Appearance:  Patient appears stated age, and in no acute distress Eyes:  Pupils are equal, round, and reactive to light and accomodation.  Extraocular movement is intact.  Conjunctivae are not inflamed  Ears:  Canals normal.  Tympanic membranes normal.  Nose:  Mildly congested turbinates.  No sinus tenderness.  Pharynx:  Normal Neck:  Supple.  Enlarged posterior/lateral nodes are palpated bilaterally, tender to palpation on the left.   Lungs:  Clear to auscultation.  Breath sounds are equal.  Moving air well. Heart:  Regular rate and rhythm without murmurs, rubs, or gallops.  Abdomen:  Nontender without masses or hepatosplenomegaly.  Bowel sounds are present.  No CVA or flank tenderness.  Extremities:  No edema.  Skin:  No rash present.    UC Treatments / Results  Labs (all labs ordered are listed, but only abnormal results are displayed) Labs Reviewed - No data to display  EKG None  Radiology No results found.  Procedures Procedures (including critical care time)  Medications Ordered in UC Medications - No data to display  Initial Impression / Assessment and Plan / UC Course  I have reviewed the triage vital signs and the nursing notes.  Pertinent labs & imaging results that were available during my care of the patient were reviewed by me and considered in my medical decision making (see chart for details).    There is no  evidence of bacterial infection today.  Treat symptomatically for now  Prescription written for Benzonatate (Tessalon) to take at bedtime for night-time cough.  Followup with Family Doctor if not improved in one week.  Final Clinical Impressions(s) / UC Diagnoses   Final diagnoses:  Viral URI with cough     Discharge Instructions  Take plain guaifenesin (  extended release tabs such as Mucinex) twice daily, with plenty of water, for cough and congestion.  Get adequate rest.   May use Afrin nasal spray (or generic oxymetazoline) each morning for about 5 days and then discontinue.  Also recommend using saline nasal spray several times daily and saline nasal irrigation (AYR is a common brand).  Try warm salt water gargles for sore throat.  Stop all antihistamines for now, and other non-prescription cough/cold preparations. May take Delsym Cough Suppressant with Tessalon at bedtime for nighttime cough.  Begin Azithromycin if not improving about one week or if persistent fever develops      ED Prescriptions    Medication Sig Dispense Auth. Provider   benzonatate (TESSALON) 200 MG capsule Take one cap by mouth at bedtime as needed for cough.  May repeat in 4 to 6 hours 15 capsule Lattie Haw, MD   azithromycin (ZITHROMAX Z-PAK) 250 MG tablet Take 2 tabs today; then begin one tab once daily for 4 more days. (Rx void after 05/19/18) 6 tablet Lattie Haw, MD        Lattie Haw, MD 05/14/18 [phone]

## 2018-05-11 NOTE — ED Triage Notes (Signed)
Interpretor Jesus [phone] [place] used.

## 2018-05-11 NOTE — ED Triage Notes (Signed)
Patient came from [place] 04/28/18; speaks primarily Spanish; has had a cough for past 5 days and nasal discharge without fever or aches; he has been using Tussinex and sudafed. He did have the influenza vaccination this season.

## 2018-05-11 NOTE — Discharge Instructions (Addendum)
Take plain guaifenesin (1200mg  extended release tabs such as Mucinex) twice daily, with plenty of water, for cough and congestion.  Get adequate rest.   May use Afrin nasal spray (or generic oxymetazoline) each morning for about 5 days and then discontinue.  Also recommend using saline nasal spray several times daily and saline nasal irrigation (AYR is a common brand).  Try warm salt water gargles for sore throat.  Stop all antihistamines for now, and other non-prescription cough/cold preparations. May take Delsym Cough Suppressant with Tessalon at bedtime for nighttime cough.  Begin Azithromycin if not improving about one week or if persistent fever develops

## 2018-05-22 ENCOUNTER — Other Ambulatory Visit: Payer: BLUE CROSS/BLUE SHIELD

## 2018-06-12 ENCOUNTER — Telehealth: Payer: Self-pay

## 2018-06-12 NOTE — Telephone Encounter (Signed)
Sean Frye- Pt was seen at urgent care last month for viral URI-- can you reach out to Pt and schedule virtual visit w/ Dr. Drue Novel please.

## 2018-06-12 NOTE — Telephone Encounter (Signed)
LVM in spanish for pt to call and schedule a VOV with Provider for Urgent care fu.

## 2018-07-05 ENCOUNTER — Telehealth: Payer: Self-pay | Admitting: *Deleted

## 2018-07-05 NOTE — Telephone Encounter (Signed)
DOS 07/11/2018, CPT CODE: 01314 - EPF LEFT FOOT  BCBS: The Member Sean Frye, date of birth 02/22/1951 does not have coverage for the date 07/05/2018. The effective dates for this policy are 02/28/2016 - 04/24/2016.  I left Sean Frye a message to call me back regarding his insurance.  I need to know if he has a different insurance.  I e-verified that he has Medicare.

## 2018-07-08 NOTE — Telephone Encounter (Signed)
I am calling in regards to [facility].  "He does not live here anymore.  He lives in [place]."  He lives in [place]?  "Yes, he lives in [place]."  [place] cancel his surgery.   I canceled his surgery for Jul 11, 2018 via the surgical center's [facility] Portal.

## 2018-07-17 NOTE — Progress Notes (Signed)
This encounter was created in error - please disregard.

## 2018-07-29 ENCOUNTER — Encounter: Payer: BLUE CROSS/BLUE SHIELD | Admitting: Podiatry

## 2018-10-07 ENCOUNTER — Other Ambulatory Visit: Payer: Self-pay | Admitting: Internal Medicine

## 2018-10-07 ENCOUNTER — Encounter (HOSPITAL_COMMUNITY): Payer: Self-pay

## 2018-10-07 ENCOUNTER — Ambulatory Visit (HOSPITAL_COMMUNITY)
Admission: EM | Admit: 2018-10-07 | Discharge: 2018-10-07 | Disposition: A | Discharge status: Home / Self Care | Payer: Self-pay | Attending: Family Medicine | Admitting: Family Medicine

## 2018-10-07 ENCOUNTER — Other Ambulatory Visit: Payer: Self-pay

## 2018-10-07 DIAGNOSIS — J019 Acute sinusitis, unspecified: Secondary | ICD-10-CM

## 2018-10-07 MED ORDER — FLUTICASONE PROPIONATE 50 MCG/ACT NA SUSP: 1.0000 | Freq: Every day | NASAL | 2 refills | Status: DC

## 2018-10-07 MED ORDER — AMOXICILLIN-POT CLAVULANATE 875-125 MG PO TABS: 1.0000 | ORAL_TABLET | Freq: Two times a day (BID) | ORAL | 0 refills | Status: DC

## 2018-10-07 MED ORDER — CETIRIZINE HCL 10 MG PO TABS: 10.0000 mg | ORAL_TABLET | Freq: Every day | ORAL | 0 refills | Status: DC

## 2018-10-07 NOTE — Telephone Encounter (Signed)
Pt overdue for 12 month f/u. Please contact pt for future appointment. Pt needing refills. 

## 2018-10-07 NOTE — Discharge Instructions (Signed)
Take the medication as prescribed for a sinus infection. Follow up as needed for continued or worsening symptoms

## 2018-10-07 NOTE — ED Triage Notes (Signed)
Pt presents with ongoing headache X 7 days with no relief with medication.

## 2018-10-08 NOTE — ED Provider Notes (Signed)
[place]    CSN: [id number] Arrival date & time: 10/07/18  8676     History   Chief Complaint Chief Complaint  Patient presents with  . Headache    HPI Sean Frye is a 68 y.o. male.   Patient is a 68 year old male with past medical history of A. fib, prediabetes.  He presents today with approximately 1 week of headache.  The headache has been pretty constant.  The headache is relieved with Tylenol but then returns.  Reporting mostly frontal headache and sometimes occipital.  He has had some slight nasal congestion and pressure in face.  He is also had some associated rhinorrhea at times.  Denies any cough, chest congestion, fever, chills or body aches.  Does have a history of seasonal allergies.  Does not take any medication for allergies.  Denies any recent sick contacts, recent traveling or known COVID exposures.  ROS per HPI      Past Medical History:  Diagnosis Date  . Atrial fibrillation ([place]) 2015  . ED (erectile dysfunction) 06/10/2014  . Prediabetes     Patient Active Problem List   Diagnosis Date Noted  . IBS (irritable bowel syndrome) 03/19/2018  . Plantar fasciitis 03/16/2016  . Dyslipidemia 12/16/2014  . PCP notes  ------------------------- 11/11/2014  . ED (erectile dysfunction) 06/10/2014  . Annual physical exam 04/07/2014  . Atrial fibrillation ([place]) 04/07/2014  . Hyperglycemia     Past Surgical History:  Procedure Laterality Date  . APPENDECTOMY  03/2017   lap converted to exploratory laparotomy with ileocecectomy  . CARDIOVERSION N/A 05/14/2014   Procedure: CARDIOVERSION;  Surgeon: Pixie Casino, MD;  Location: [facility] ENDOSCOPY;  Service: Cardiovascular;  Laterality: N/A;  . CHOLECYSTECTOMY  2005  . lipoma removal    . TEE WITHOUT CARDIOVERSION N/A 05/14/2014   Procedure: TRANSESOPHAGEAL ECHOCARDIOGRAM (TEE);  Surgeon: Pixie Casino, MD;  Location: [facility] ENDOSCOPY;  Service: Cardiovascular;  Laterality: N/A;       Home  Medications    Prior to Admission medications   Medication Sig Start Date End Date Taking? Authorizing Provider  amoxicillin-clavulanate (AUGMENTIN) 875-125 MG tablet Take 1 tablet by mouth every 12 (twelve) hours. 10/07/18   Loura Halt A, NP  aspirin EC 81 MG tablet Take 1 tablet (81 mg total) by mouth daily. 09/03/15   Hilty, Nadean Corwin, MD  atorvastatin (LIPITOR) 40 MG tablet Take 1 tablet (40 mg total) by mouth daily. 01/10/18   Colon Branch, MD  cetirizine (ZYRTEC) 10 MG tablet Take 1 tablet (10 mg total) by mouth daily. 10/07/18   Loura Halt A, NP  dicyclomine (BENTYL) 20 MG tablet Take 1 tablet (20 mg total) by mouth 3 (three) times daily before meals. 03/18/18   Colon Branch, MD  fluticasone [facility]) 50 MCG/ACT nasal spray Place 1 spray into both nostrils daily. 10/07/18   Loura Halt A, NP  metoprolol succinate (TOPROL XL) 25 MG 24 hr tablet Take 0.5 tablets (12.5 mg total) by mouth daily. 07/30/17   Hilty, Nadean Corwin, MD  omeprazole (PRILOSEC) 40 MG capsule Take 1 capsule (40 mg total) by mouth daily. 03/18/18   Colon Branch, MD    Family History Family History  Problem Relation Age of Onset  . Diabetes Sister   . Diabetes Mother   . Cirrhosis Mother   . Prostate cancer Father   . CAD Neg Hx   . Colon cancer Neg Hx     Social History Social History  Tobacco Use  . Smoking status: Never Smoker  . Smokeless tobacco: Never Used  Substance Use Topics  . Alcohol use: No    Alcohol/week: 0.0 standard drinks  . Drug use: No     Allergies   Patient has no known allergies.   Review of Systems Review of Systems   Physical Exam Triage Vital Signs ED Triage Vitals  Enc Vitals Group     BP 10/07/18 0829 130/85     Pulse Rate 10/07/18 0829 92     Resp 10/07/18 0829 16     Temp 10/07/18 0829 98.1 F (36.7 C)     Temp Source 10/07/18 0829 Temporal     SpO2 10/07/18 0829 99 %     Weight --      Height --      Head Circumference --      Peak Flow --      Pain Score 10/07/18  0835 7     Pain Loc --      Pain Edu? --      Excl. in GC? --    No data found.  Updated Vital Signs BP 130/85 (BP Location: Left Arm)   Pulse 92   Temp 98.1 F (36.7 C) (Temporal)   Resp 16   SpO2 99%   Visual Acuity Right Eye Distance:   Left Eye Distance:   Bilateral Distance:    Right Eye Near:   Left Eye Near:    Bilateral Near:     Physical Exam Vitals signs and nursing note reviewed.  Constitutional:      General: He is not in acute distress.    Appearance: He is well-developed. He is not ill-appearing, toxic-appearing or diaphoretic.  HENT:     Head: Normocephalic and atraumatic.     Right Ear: Tympanic membrane and ear canal normal.     Left Ear: Tympanic membrane and ear canal normal.     Nose: Congestion and rhinorrhea present.     Right Turbinates: Swollen.     Left Turbinates: Swollen.     Right Sinus: Frontal sinus tenderness present.     Left Sinus: Frontal sinus tenderness present.     Mouth/Throat:     Mouth: Mucous membranes are moist.  Eyes:     Conjunctiva/sclera: Conjunctivae normal.  Neck:     Musculoskeletal: Normal range of motion.  Cardiovascular:     Pulses: Normal pulses.     Heart sounds: Normal heart sounds.  Pulmonary:     Effort: Pulmonary effort is normal.     Breath sounds: Normal breath sounds.  Musculoskeletal: Normal range of motion.  Skin:    General: Skin is warm and dry.  Neurological:     Mental Status: He is alert.  Psychiatric:        Mood and Affect: Mood normal.      UC Treatments / Results  Labs (all labs ordered are listed, but only abnormal results are displayed) Labs Reviewed - No data to display  EKG   Radiology No results found.  Procedures Procedures (including critical care time)  Medications Ordered in UC Medications - No data to display  Initial Impression / Assessment and Plan / UC Course  I have reviewed the triage vital signs and the nursing notes.  Pertinent labs & imaging  results that were available during my care of the patient were reviewed by me and considered in my medical decision making (see chart for details).     Symptoms and  exam consistent with sinus headache most likely due to bacterial sinusitis. Patient with sinus tenderness and thick mucus in nasal passages.  Nasal turbinate swelling. We will go ahead and treat for sinus infection with Augmentin twice a day for 7 days Also treating with Flonase and Zyrtec daily Follow up as needed for continued or worsening symptoms  Final Clinical Impressions(s) / UC Diagnoses   Final diagnoses:  Acute non-recurrent sinusitis, unspecified location     Discharge Instructions     Take the medication as prescribed for a sinus infection. Follow up as needed for continued or worsening symptoms     ED Prescriptions    Medication Sig Dispense Auth. Provider   amoxicillin-clavulanate (AUGMENTIN) 875-125 MG tablet Take 1 tablet by mouth every 12 (twelve) hours. 14 tablet Marqueta Pulley A, NP   fluticasone (FLONASE) 50 MCG/ACT nasal spray Place 1 spray into both nostrils daily. 16 g Gagandeep Kossman A, NP   cetirizine (ZYRTEC) 10 MG tablet Take 1 tablet (10 mg total) by mouth daily. 30 tablet Dahlia ByesBast, Arrielle Mcginn A, NP     Controlled Substance Prescriptions [place] Controlled Substance Registry consulted? Not Applicable   Janace ArisBast, Nadie Fiumara A, NP 10/08/18 [id number]

## 2018-10-14 ENCOUNTER — Encounter: Payer: Self-pay | Admitting: Internal Medicine

## 2018-10-14 ENCOUNTER — Other Ambulatory Visit: Payer: Self-pay

## 2018-10-14 ENCOUNTER — Ambulatory Visit (INDEPENDENT_AMBULATORY_CARE_PROVIDER_SITE_OTHER): Payer: Self-pay | Admitting: Internal Medicine

## 2018-10-14 VITALS — BP 126/84 | HR 88 | Ht 71.0 in | Wt 217.8 lb

## 2018-10-14 DIAGNOSIS — E785 Hyperlipidemia, unspecified: Secondary | ICD-10-CM

## 2018-10-14 DIAGNOSIS — I4819 Other persistent atrial fibrillation: Secondary | ICD-10-CM

## 2018-10-14 MED ORDER — METOPROLOL SUCCINATE ER 25 MG PO TB24: ORAL_TABLET | ORAL | 3 refills | Status: DC

## 2018-10-14 NOTE — Progress Notes (Signed)
OFFICE NOTE  Chief Complaint:  No complaints  Physician: Wanda PlumpPaz, Jose E, MD  HPI:  Sean BandaHector E Frye is a pleasant 68 year old male who is coming referred to me for evaluation of atrial fibrillation. Apparently he had an EKG in August of this past year which demonstrated A. fib and was referred to cardiology, but follow-up was never made. He was scheduled to see me in December but had to travel to [place] for family. He recently saw his primary care provider and was again noted to be in A. fib. He was started on aspirin which she's been taking for 2 weeks. He is 89 with no cardiac risk factors therefore has a low CHADSVASC score of 0-1. He is totally unaware of his atrial fibrillation. He denies any worsening shortness of breath, fatigue or exercise intolerance. He also denies any chest pain. He continues to do some exercise and some weight lifting without any limitations. He reports good sleep at night and denies snoring or any signs of sleep apnea. There is no history that he is aware of of A. fib in the family.  I recommended TEE cardioversion. He did undergo a TEE which showed no evidence of left atrial thrombus and normal LV function. There is mild valvular disease but nothing of concern. He underwent successful cardioversion and has been maintaining sinus rhythm. He says that he's feeling better and has more energy. He discontinued his Xarelto when he ran out of his prescription yesterday. He's also run out of metoprolol 3 days ago. He was not under the impression that he needed to maintain these medications. We had a long discussion today with the help of the Spanish translator, and I made it clear that he should stay on metoprolol to try to help keep him from going out of rhythm. However, because of his low risk for stroke, at this point I think he can be on daily aspirin. Xarelto was fairly expensive. There isn't clear data that he needs to be on it with his low CHADSVASC score now one month  post cardioversion. Should he have recurrence of A. fib, however he may need to go back on more significant anticoagulation.   Sean Frye returns today for follow-up. Overall he is doing well. He's had no recurrent A. fib. EKG today sinus rhythm. He's currently on aspirin. He reports some ongoing problems with erectile dysfunction. He is currently using 120 mg tablet of sildenafil. I reiterated that he could use up to 3 tablets as needed. He remains on low-dose metoprolol for rate control and blood pressure which is tolerating. He was recently started on low-dose atorvastatin for cholesterol.   09/03/2015  Sean Frye returns today for follow-up. He was referred back from Dr. Phylis BougiePonce after recent office visit in which she was noted to have an irregular rhythm. An EKG demonstrated he was back in atrial fibrillation, however he reports he has no symptoms of A. fib. In fact [place] significant annual labor without any chest pain, shortness of breath or worsening fatigue. 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 Since he has had recurrence within a fairly short time, the likelihood of recurrent A. fib has increased going forward. We discussed options including repeat cardioversion since he's been on one month of Xarelto which was started by his primary care provider or consider continuing rate control and long-term anticoagulation. Again, I'm not certain that he has  any history of hypertension therefore his CHADSVASC score now is 0, but will go up to 1 when he turned 66. This would put his stroke risk summer between 0 and 1% annually. Based on current guidelines, it is reasonable to treat him with aspirin therapy which should lower his stroke risk to 0.5%.  07/30/2017  Sean Frye returns for follow-up of his A. fib.  He was seen today with a Spanish translator named Sean Frye, [phone] by video translator  service.  Sean Frye had denies any chest pain or worsening shortness of breath.  He is unaware of his A. fib which she is persistently in by EKG today.  There was some confusion on medications, specifically he was taking atorvastatin every other day as needed.  He understands and was advised to take that medication on a daily basis.  He reports compliance with aspirin and metoprolol as directed.  We discussed the fact that he is now [age] and per guidelines does have an additional cardiac risk factor.  He is CHADSVASC score however is 1 with a stroke risk of about 1.3%.  Aspirin should lower that risk to 1% and he could be further reduced in wrist to about 0.4 to 0.5% on Eliquis or Xarelto, however the bleeding risk does go up from 1% to 3%.  10/14/2018  Sean Frye is seen today in follow-up.  He is accompanied by professional Engineer, structuralpanish translator.  Overall he continues to do well.  He denies any chest pain or worsening shortness of breath.  He has had no symptoms with atrial fibrillation although EKG shows he is persistent.  Heart rate today is in the 80s demonstrating good rate control.  Blood pressure is normal and demonstrates no hypertension. He is compliant with anticoagulation.  He is currently on aspirin.  See the previous note regarding his stroke risk calculation.  Although he is a year older when we recalculated his bleeding risk is higher on Eliquis at about 3% despite a reduction in stroke risk to 0.4%.  His bleeding and stroke risk reduction are about equal on aspirin and improved over no therapy.  Based on this, he has elected to continue on aspirin alone.  PMHx:  Past Medical History:  Diagnosis Date   Atrial fibrillation [facility](HCC) 2015   ED (erectile dysfunction) 06/10/2014   Prediabetes     Past Surgical History:  Procedure Laterality Date   APPENDECTOMY  03/2017   lap converted to exploratory laparotomy with ileocecectomy   CARDIOVERSION N/A 05/14/2014   Procedure:  CARDIOVERSION;  Surgeon: Chrystie NoseKenneth C Jurgen Groeneveld, MD;  [facility];  Service: Cardiovascular;  Laterality: N/A;   CHOLECYSTECTOMY  2005   lipoma removal     TEE WITHOUT CARDIOVERSION N/A 05/14/2014   Procedure: TRANSESOPHAGEAL ECHOCARDIOGRAM (TEE);  Surgeon: Chrystie NoseKenneth C Ahsan Esterline, MD;  [facility];  Service: Cardiovascular;  Laterality: N/A;    FAMHx:  Family History  Problem Relation Age of Onset   Diabetes Sister    Diabetes Mother    Cirrhosis Mother    Prostate cancer Father    CAD Neg Hx    Colon cancer Neg Hx     SOCHx:   reports that he has never smoked. He has never used smokeless tobacco. He reports that he does not drink alcohol or use drugs.  ALLERGIES:  No Known Allergies  ROS: Pertinent items noted in HPI and remainder of comprehensive ROS otherwise negative.  HOME MEDS: Current Outpatient Medications  Medication Sig Dispense Refill   amoxicillin-clavulanate (AUGMENTIN)  875-125 MG tablet Take 1 tablet by mouth every 12 (twelve) hours. 14 tablet 0   aspirin EC 81 MG tablet Take 1 tablet (81 mg total) by mouth daily. 90 tablet 3   atorvastatin (LIPITOR) 40 MG tablet Take 1 tablet (40 mg total) by mouth daily. 90 tablet 2   cetirizine (ZYRTEC) 10 MG tablet Take 1 tablet (10 mg total) by mouth daily. 30 tablet 0   dicyclomine (BENTYL) 20 MG tablet Take 1 tablet (20 mg total) by mouth 3 (three) times daily before meals. 90 tablet 3   fluticasone (FLONASE) 50 MCG/ACT nasal spray Place 1 spray into both nostrils daily. 16 g 2   metoprolol succinate (TOPROL-XL) 25 MG 24 hr tablet Take 1/2 (one-half) tablet by mouth once daily 45 tablet 0   omeprazole (PRILOSEC) 40 MG capsule Take 1 capsule (40 mg total) by mouth daily. 30 capsule 10   No current facility-administered medications for this visit.     LABS/IMAGING: No results found for this or any previous visit (from the past 48 hour(s)). No results found.  VITALS: BP 126/84    Pulse 88    Ht  5\' 11"  (1.803 m)    Wt 217 lb 12.8 oz (98.8 kg)    SpO2 98%    BMI 30.38 kg/m   EXAM: General appearance: alert and no distress Neck: no carotid bruit, no JVD and thyroid not enlarged, symmetric, no tenderness/mass/nodules Lungs: clear to auscultation bilaterally Heart: irregularly irregular rhythm Abdomen: soft, non-tender; bowel sounds normal; no masses,  no organomegaly Extremities: extremities normal, atraumatic, no cyanosis or edema Pulses: 2+ and symmetric Skin: Skin color, texture, turgor normal. No rashes or lesions Neurologic: Grossly normal Psych: Pleasant  EKG: A. fib with controlled ventricular response at 88-personally reviewed  ASSESSMENT: 1. Long-standing persistent A-fib 2. CHADSVASC score of 1 (male >66 yrs age) -on aspirin 3. Erectile dysfunction 4. Dyslipidemia   PLAN: 1.   Mr. Amalia Hailey he needs to be asymptomatic with his A. fib.  It is rate controlled and he is elected to remain on aspirin.  His CHADSVASC score is 1 for age but has no history of hypertension, diabetes, stroke, TIA or congestive heart failure.  He understands that his stroke risk is key motive and will go up as he gets older but for now it seems reasonable due to the higher bleeding risk associated with a direct oral anticoagulant to remain on aspirin.  Plan follow-up with me annually or sooner as necessary.  Pixie Casino, MD, [facility], [place] Director of the Advanced Lipid Disorders &  Cardiovascular Risk Reduction Clinic Diplomate of the American Board of Clinical Lipidology Attending Cardiologist  Direct Dial: [phone]   Fax: [phone]  Website:  [email or web address] 10/14/2018, 9:57 AM

## 2018-10-14 NOTE — Patient Instructions (Addendum)
Medication Instructions:  Your physician recommends that you continue on your current medications as directed. Please refer to the Current Medication list given to you today.  If you need a refill on your cardiac medications before your next appointment, please call your pharmacy.   Lab work: None ordered If you have labs (blood work) drawn today and your tests are completely normal, you will receive your results only by: [place] (if you have MyChart) OR A paper copy in the mail If you have any lab test that is abnormal or we need to change your treatment, we will call you to review the results.  Testing/Procedures: None ordered  Follow-Up: At [facility], you and your health needs are our priority.  As part of our continuing mission to provide you with exceptional heart care, we have created designated Provider Care Teams.  These Care Teams include your primary Cardiologist (physician) and Advanced Practice Providers (APPs -  Physician Assistants and Nurse Practitioners) who all work together to provide you with the care you need, when you need it. You will need a follow up appointment in 12 months.  Please call our office 2 months in advance to schedule this appointment.  You may see Dr. Debara Pickett or one of the following Advanced Practice Providers on your designated Care Team: Almyra Deforest, PA-C Fabian Sharp, [place]

## 2018-10-16 ENCOUNTER — Encounter: Payer: Self-pay | Admitting: Internal Medicine

## 2019-02-05 ENCOUNTER — Other Ambulatory Visit: Payer: Self-pay

## 2019-02-05 DIAGNOSIS — Z20822 Contact with and (suspected) exposure to covid-19: Secondary | ICD-10-CM

## 2019-02-07 LAB — NOVEL CORONAVIRUS, NAA: SARS-CoV-2, NAA: NOT DETECTED

## 2019-04-12 ENCOUNTER — Ambulatory Visit: Payer: Self-pay | Attending: Internal Medicine

## 2019-04-12 DIAGNOSIS — Z23 Encounter for immunization: Secondary | ICD-10-CM | POA: Insufficient documentation

## 2019-04-12 NOTE — Progress Notes (Signed)
   Covid-19 Vaccination Clinic  Name:  Sean Frye    MRN: [id number] DOB: 1950-06-29  04/12/2019  Mr. Sean Frye was observed post Covid-19 immunization for  15 minutes without incidence. He was provided with Vaccine Information Sheet and instruction to access the V-Safe system.   Mr. Sean Frye was instructed to call 911 with any severe reactions post vaccine: Marland Kitchen Difficulty breathing  . Swelling of your face and throat  . A fast heartbeat  . A bad rash all over your body  . Dizziness and weakness    Immunizations Administered    Name Date Dose VIS Date Route   Pfizer COVID-19 Vaccine 04/12/2019  9:07 AM 0.3 mL 02/07/2019 Intramuscular   Manufacturer: [employer], [employer]   Lot: [id number]   NDC: [address]-6

## 2019-05-05 ENCOUNTER — Ambulatory Visit: Payer: Self-pay | Attending: Internal Medicine

## 2019-05-05 DIAGNOSIS — Z23 Encounter for immunization: Secondary | ICD-10-CM | POA: Insufficient documentation

## 2019-05-05 NOTE — Progress Notes (Signed)
   Covid-19 Vaccination Clinic  Name:  DAWUD MAYS    MRN: [id number] DOB: 1950/06/20  05/05/2019  [name] was observed post Covid-19 immunization for 15 minutes without incident. He was provided with Vaccine Information Sheet and instruction to access the V-Safe system.   [name] was instructed to call 911 with any severe reactions post vaccine: Marland Kitchen Difficulty breathing  . Swelling of face and throat  . A fast heartbeat  . A bad rash all over body  . Dizziness and weakness   Immunizations Administered    Name Date Dose VIS Date Route   Pfizer COVID-19 Vaccine 05/05/2019  9:15 AM 0.3 mL 02/07/2019 Intramuscular   Manufacturer: [employer], [employer]   Lot: [id number]   NDC: [address]-9

## 2019-06-13 ENCOUNTER — Other Ambulatory Visit: Payer: Self-pay

## 2019-06-13 ENCOUNTER — Ambulatory Visit (HOSPITAL_COMMUNITY)
Admission: EM | Admit: 2019-06-13 | Discharge: 2019-06-13 | Disposition: A | Discharge status: Home / Self Care | Payer: Medicare Other | Attending: Family Medicine | Admitting: Family Medicine

## 2019-06-13 ENCOUNTER — Encounter (HOSPITAL_COMMUNITY): Payer: Self-pay

## 2019-06-13 DIAGNOSIS — H1031 Unspecified acute conjunctivitis, right eye: Secondary | ICD-10-CM

## 2019-06-13 MED ORDER — NEOMYCIN-POLYMYXIN-DEXAMETH 3.5-10000-0.1 OP SUSP: 1.0000 [drp] | OPHTHALMIC | 0 refills | Status: AC

## 2019-06-13 NOTE — ED Provider Notes (Signed)
[facility]    CSN: [id number] Arrival date & time: 06/13/19  1346      History   Chief Complaint Chief Complaint  Patient presents with  . Eye Problem    HPI Sean KNOTH is Frye 69 y.o. male.  Seen with interpreter [name]   Here for red eye Has had trouble with allergies and itchy eyes for 2 months Is using pataday from the pharmacy with good result until today Awoke today with red eye, light sensitive' swollen shut.  Yellow discharge. Could not go to work today No other cold symptoms No prior eye problems   Past Medical History:  Diagnosis Date  . Atrial fibrillation (HCC) 2015  . ED (erectile dysfunction) 06/10/2014  . Prediabetes     Patient Active Problem List   Diagnosis Date Noted  . IBS (irritable bowel syndrome) 03/19/2018  . Plantar fasciitis 03/16/2016  . Dyslipidemia 12/16/2014  . PCP notes  ------------------------- 11/11/2014  . ED (erectile dysfunction) 06/10/2014  . Annual physical exam 04/07/2014  . Atrial fibrillation (HCC) 04/07/2014  . Hyperglycemia     Past Surgical History:  Procedure Laterality Date  . APPENDECTOMY  03/2017   lap converted to exploratory laparotomy with ileocecectomy  . CARDIOVERSION N/Frye 05/14/2014   Procedure: CARDIOVERSION;  Surgeon: Chrystie Nose, Sean Frye;  Location: [facility] ENDOSCOPY;  Service: Cardiovascular;  Laterality: N/Frye;  . CHOLECYSTECTOMY  2005  . lipoma removal    . TEE WITHOUT CARDIOVERSION N/Frye 05/14/2014   Procedure: TRANSESOPHAGEAL ECHOCARDIOGRAM (TEE);  Surgeon: Chrystie Nose, Sean Frye;  Location: [facility] ENDOSCOPY;  Service: Cardiovascular;  Laterality: N/Frye;       Home Medications    Prior to Admission medications   Medication Sig Start Date End Date Taking? Authorizing Provider  Olopatadine HCl (PATADAY OP) Apply to eye.   Yes Provider, Historical, Sean Frye  neomycin-polymyxin b-dexamethasone (MAXITROL) 3.5-10000-0.1 SUSP Place 1 drop into the right eye every 4 (four) hours for 5 days. 06/13/19  06/18/19  Sean Moore, Sean Frye  omeprazole (PRILOSEC) 40 MG capsule Take 1 capsule (40 mg total) by mouth daily. 03/18/18   Sean Plump, Sean Frye  atorvastatin (LIPITOR) 40 MG tablet Take 1 tablet (40 mg total) by mouth daily. 01/10/18 06/13/19  Sean Plump, Sean Frye  cetirizine (ZYRTEC) 10 MG tablet Take 1 tablet (10 mg total) by mouth daily. 10/07/18 06/13/19  Sean Byes Frye, Sean Frye  dicyclomine (BENTYL) 20 MG tablet Take 1 tablet (20 mg total) by mouth 3 (three) times daily before meals. 03/18/18 06/13/19  Sean Plump, Sean Frye  fluticasone (FLONASE) 50 MCG/ACT nasal spray Place 1 spray into both nostrils daily. 10/07/18 06/13/19  Sean Byes Frye, Sean Frye  metoprolol succinate (TOPROL-XL) 25 MG 24 hr tablet Take 1/2 (one-half) tablet by mouth once daily 10/14/18 06/13/19  Sean Frye, Sean Abu, Sean Frye    Family History Family History  Problem Relation Age of Onset  . Diabetes Sister   . Diabetes Mother   . Cirrhosis Mother   . Prostate cancer Father   . CAD Neg Hx   . Colon cancer Neg Hx     Social History Social History   Tobacco Use  . Smoking status: Never Smoker  . Smokeless tobacco: Never Used  Substance Use Topics  . Alcohol use: No    Alcohol/week: 0.0 standard drinks  . Drug use: No     Allergies   Patient has no known allergies.   Review of Systems Review of Systems  HENT: Negative for congestion and  rhinorrhea.   Eyes: Positive for photophobia, discharge, redness and itching. Negative for pain and visual disturbance.     Physical Exam Triage Vital Signs ED Triage Vitals  Enc Vitals Group     BP 06/13/19 1436 139/81     Pulse Rate 06/13/19 1436 87     Resp 06/13/19 1436 20     Temp 06/13/19 1436 98.2 F (36.8 C)     Temp Source 06/13/19 1436 Oral     SpO2 06/13/19 1436 100 %     Weight --      Height --      Head Circumference --      Peak Flow --      Pain Score 06/13/19 1433 7     Pain Loc --      Pain Edu? --      Excl. in GC? --    No data found.  Updated Vital Signs BP 139/81 (BP  Location: Right Arm)   Pulse 87   Temp 98.2 F (36.8 C) (Oral)   Resp 20   SpO2 100%  Physical Exam Constitutional:      General: He is not in acute distress.    Appearance: He is well-developed.  HENT:     Head: Normocephalic and atraumatic.     Mouth/Throat:     Comments: Mask in place Eyes:     General: Lids are normal. Lids are everted, no foreign bodies appreciated. Vision grossly intact. Gaze aligned appropriately. No visual field deficit or scleral icterus.       Right eye: Discharge present. No foreign body or hordeolum.     Conjunctiva/sclera:     Right eye: Right conjunctiva is injected. Chemosis present. No exudate or hemorrhage.    Pupils: Pupils are equal, round, and reactive to light.  Cardiovascular:     Rate and Rhythm: Normal rate.  Pulmonary:     Effort: Pulmonary effort is normal. No respiratory distress.  Abdominal:     General: There is no distension.     Palpations: Abdomen is soft.  Musculoskeletal:        General: Normal range of motion.     Cervical back: Normal range of motion.  Skin:    General: Skin is warm and dry.  Neurological:     Mental Status: He is alert.  Psychiatric:        Mood and Affect: Mood normal.        Behavior: Behavior normal.      UC Treatments / Results  Labs (all labs ordered are listed, but only abnormal results are displayed) Labs Reviewed - No data to display  EKG   Radiology No results found.  Procedures Procedures (including critical care time)  Medications Ordered in UC Medications - No data to display  Initial Impression / Assessment and Plan / UC Course  I have reviewed the triage vital signs and the nursing notes.  Pertinent labs & imaging results that were available during my care of the patient were reviewed by me and considered in my medical decision making (see chart for details).      Final Clinical Impressions(s) / UC Diagnoses   Final diagnoses:  Acute conjunctivitis of right eye,  unspecified acute conjunctivitis type     Discharge Instructions     Use eye drops as directed Use no more than 5-7 days See eye doctor if not better in Frye couple of days   ED Prescriptions    Medication Sig Dispense Auth.  Provider   neomycin-polymyxin b-dexamethasone (MAXITROL) 3.5-10000-0.1 SUSP Place 1 drop into the right eye every 4 (four) hours for 5 days. 1.5 mL Raylene Everts, Sean Frye     PDMP not reviewed this encounter.   Raylene Everts, Sean Frye 06/13/19 [phone]

## 2019-06-13 NOTE — Discharge Instructions (Signed)
Use eye drops as directed Use no more than 5-7 days See eye doctor if not better in a couple of days

## 2019-06-13 NOTE — ED Triage Notes (Signed)
Pt presents to UC with right eye pain, itching on and off x 2 months. Pt states this morning he was no able to open his right eye because of the pain. Pt is using Pataday x 1 week, with somewhat relief. Pt denies any blurred vision or change in his vision.

## 2020-04-09 NOTE — Telephone Encounter (Signed)
Error

## 2020-05-07 ENCOUNTER — Encounter: Payer: Self-pay | Admitting: Internal Medicine
